# Patient Record
Sex: Male | Born: 1970 | Race: White | Hispanic: No | Smoking: Never smoker
Health system: Southern US, Community
[De-identification: ages and names within clinical notes are randomized; demographics above are authoritative.]

## PROBLEM LIST (undated history)

## (undated) DIAGNOSIS — Z9889 Other specified postprocedural states: Secondary | ICD-10-CM

## (undated) DIAGNOSIS — B029 Zoster without complications: Secondary | ICD-10-CM

## (undated) DIAGNOSIS — T63301A Toxic effect of unspecified spider venom, accidental (unintentional), initial encounter: Secondary | ICD-10-CM

## (undated) DIAGNOSIS — A4902 Methicillin resistant Staphylococcus aureus infection, unspecified site: Secondary | ICD-10-CM

## (undated) DIAGNOSIS — K219 Gastro-esophageal reflux disease without esophagitis: Secondary | ICD-10-CM

## (undated) HISTORY — DX: Toxic effect of unspecified spider venom, accidental (unintentional), initial encounter: T63.301A

## (undated) HISTORY — DX: Methicillin resistant Staphylococcus aureus infection, unspecified site: A49.02

## (undated) HISTORY — DX: Zoster without complications: B02.9

## (undated) HISTORY — DX: Gastro-esophageal reflux disease without esophagitis: K21.9

## (undated) HISTORY — PX: INGUINAL HERNIA REPAIR: SUR1180

## (undated) HISTORY — DX: Other specified postprocedural states: Z98.890

---

## 2004-11-12 DIAGNOSIS — Z9889 Other specified postprocedural states: Secondary | ICD-10-CM

## 2004-11-12 HISTORY — DX: Other specified postprocedural states: Z98.890

## 2007-02-24 DIAGNOSIS — Z9889 Other specified postprocedural states: Secondary | ICD-10-CM

## 2007-02-24 HISTORY — DX: Other specified postprocedural states: Z98.890

## 2011-05-18 ENCOUNTER — Encounter: Payer: Self-pay | Admitting: Urgent Care

## 2011-05-18 ENCOUNTER — Ambulatory Visit (INDEPENDENT_AMBULATORY_CARE_PROVIDER_SITE_OTHER): Admitting: Urgent Care

## 2011-05-18 VITALS — BP 131/85 | HR 57 | Temp 97.8°F | Ht 73.0 in | Wt 218.4 lb

## 2011-05-18 DIAGNOSIS — K625 Hemorrhage of anus and rectum: Secondary | ICD-10-CM | POA: Insufficient documentation

## 2011-05-18 LAB — CBC WITH DIFFERENTIAL/PLATELET
Basophils Absolute: 0 10*3/uL (ref 0.0–0.1)
Basophils Relative: 0 % (ref 0–1)
HCT: 41.6 % (ref 39.0–52.0)
Lymphocytes Relative: 29 % (ref 12–46)
Monocytes Absolute: 0.6 10*3/uL (ref 0.1–1.0)
Neutro Abs: 4.7 10*3/uL (ref 1.7–7.7)
Neutrophils Relative %: 61 % (ref 43–77)
RDW: 13.2 % (ref 11.5–15.5)
WBC: 7.7 10*3/uL (ref 4.0–10.5)

## 2011-05-18 MED ORDER — LIDOCAINE-HYDROCORTISONE ACE 3-1 % RE KIT: 1.0000 "application " | PACK | Freq: Two times a day (BID) | RECTAL | Status: DC

## 2011-05-18 NOTE — Progress Notes (Signed)
Primary Care Physician:  Zachery Dauer, MD, MD Primary Gastroenterologist:  Dr. Darrick Penna  Chief Complaint  Patient presents with  . Rectal Bleeding   HPI:  Ryan Christian is a 40 y.o. male here as a new patient for rectal bleeding.  Hx hemorrhoids on previous colonoscopies in 2008 &2009 done in [place].   C/o rectal bleeding 5 nights ago, was moving things out of his truck & noticed bright red bled through his pants.  +clots.  Continued bleeding x 30 mins,  [facility] ER & left after a few hrs-no labs or xrays.  BM normal once daily, soft & brown.  Taking prilosec 20mg  as needed a couple times per wk w/ certain foods.  Occ [employer].  Denies fever, chills, nausea or vomiting.  C/o fatigue.  Denies abd pain or proctalgia.   Heartburn or indigestion 3 times per week.  Hx EGD w/ Dr Allena Katz 6 yrs ago-hiatal hernia.  Denies dysphagia or odynophagia.  Appetite ok.  Wt stable.  Past Medical History  Diagnosis Date  . Hemorrhoids   . S/P colonoscopy 02/24/2007    Dr Cyndia Skeeters hemorrhoids  . S/P colonoscopy 06/09/2008    Dr Isaiah Serge Rashid-internal hemorrhoids  . S/P endoscopy 2006    Dr Patel-Hiatal hernia, otherwise normal  . GERD (gastroesophageal reflux disease)    Past Surgical History  Procedure Date  . Inguinal hernia repair 2011    [place], [place] Dr. Cameron Proud   Current Outpatient Prescriptions  Medication Sig Dispense Refill  . Aspirin-Acetaminophen (GOODYS BODY PAIN PO) Take 1 Package by mouth as needed.        Marland Kitchen omeprazole (PRILOSEC) 20 MG capsule Take 20 mg by mouth daily.        Marland Kitchen lidocaine-hydrocortisone (ANAMANTLE HC FORTE) 3-1 % KIT Place 1 application rectally 2 (two) times daily.  20 each  0  . naproxen sodium (ANAPROX) 220 MG tablet Take 220 mg by mouth 2 (two) times daily with a meal.         Allergies as of 05/18/2011  . (No Known Allergies)   Family History:  There is no known family history of colorectal carcinoma , liver disease, or inflammatory bowel disease.  History    Social History  . Marital Status: Unknown    Spouse Name: N/A    Number of Children: 2  . Years of Education: N/A   Occupational History  . Building maintenance   . [employer]    Social History Main Topics  . Smoking status: Never Smoker   . Smokeless tobacco: Not on file  . Alcohol Use: No  . Drug Use: No  . Sexually Active: Not on file   Other Topics Concern  . Not on file   Social History Narrative   married   Review of Systems: Gen: Denies any fever, chills, sweats, anorexia, fatigue, weakness, malaise, weight loss, and sleep disorder CV: Denies chest pain, angina, palpitations, syncope, orthopnea, PND, peripheral edema, and claudication. Resp: Denies dyspnea at rest, dyspnea with exercise, cough, sputum, wheezing, coughing up blood, and pleurisy. GI: Denies vomiting blood, jaundice, and fecal incontinence.    GU : Denies urinary burning, blood in urine, urinary frequency, urinary hesitancy, nocturnal urination, and urinary incontinence. MS: Denies joint pain, limitation of movement, and swelling, stiffness, low back pain, extremity pain. Denies muscle weakness, cramps, atrophy.  Derm: Denies rash, itching, dry skin, hives, moles, warts, or unhealing ulcers.  Psych: Denies depression, anxiety, memory loss, suicidal ideation, hallucinations, paranoia, and confusion. Heme: Denies bruising  and enlarged lymph nodes.  Physical Exam: BP 131/85  Pulse 57  Temp(Src) 97.8 F (36.6 C) (Temporal)  Ht 6\' 1"  (1.854 m)  Wt 218 lb 6.4 oz (99.066 kg)  BMI 28.81 kg/m2 General:   Alert,  Well-developed, well-nourished, pleasant and cooperative in NAD Head:  Normocephalic and atraumatic. Eyes:  Sclera clear, no icterus.   Conjunctiva pink. Ears:  Normal auditory acuity. Nose:  No deformity, discharge,  or lesions. Mouth:  No deformity or lesions, dentition normal. Neck:  Supple; no masses or thyromegaly. Lungs:  Clear throughout to auscultation.   No wheezes, crackles, or  rhonchi. No acute distress. Heart:  Regular rate and rhythm; no murmurs, clicks, rubs,  or gallops. Abdomen:  Soft, nontender and nondistended. No masses, hepatosplenomegaly or hernias noted. Normal bowel sounds, without guarding, and without rebound.   Rectal:  Large protruding external hemorrhoid-non-bleeding-non-tender.  No internal masses. Msk:  Symmetrical without gross deformities. Normal posture. Pulses:  Normal pulses noted. Extremities:  Without clubbing or edema. Neurologic:  Alert and  oriented x4;  grossly normal neurologically. Skin:  Intact without significant lesions or rashes. Cervical Nodes:  No significant cervical adenopathy. Psych:  Alert and cooperative. Normal mood and affect.

## 2011-05-18 NOTE — Patient Instructions (Signed)
If severe bleeding to ER If no better w/ treatment, you will need to see a surgeon NO heavy lifting OR STRAINING!   Hemorrhoids Hemorrhoids are dilated (enlarged) veins around the rectum. Sometimes clots will form in the veins. This makes them swollen and painful. These are called thrombosed hemorrhoids. Causes of hemorrhoids include:  Pregnancy: this increases the pressure in the hemorrhoidal veins.   Constipation.   Straining to have a bowel movement.  HOME CARE INSTRUCTIONS  Eat a well balanced diet and drink 6 to 8 glasses of water every day to avoid constipation. You may also use a bulk laxative.   Avoid straining to have bowel movements.   Keep anal area dry and clean.   Only take over-the-counter or prescription medicines for pain, discomfort, or fever as directed by your caregiver.  If thrombosed:  Take hot sitz baths for 20 to 30 minutes, 3 to 4 times per day.   If the hemorrhoids are very tender and swollen, place ice packs on area as tolerated. Using ice packs between sitz baths may be helpful. Fill a plastic bag with ice and use a towel between the bag of ice and your skin.   Special creams and suppositories (Anusol, Nupercainal, Wyanoids) may be used or applied as directed.   Do not use a donut shaped pillow or sit on the toilet for long periods. This increases blood pooling and pain.   Move your bowels when your body has the urge; this will require less straining and will decrease pain and pressure.   Only take over-the-counter or prescription medicines for pain, discomfort, or fever as directed by your caregiver.  SEEK MEDICAL CARE IF:  You have increasing pain and swelling that is not controlled with your prescription.   You have uncontrolled bleeding.   You have an inability or difficulty having a bowel movement.   You have pain or inflammation outside the area of the hemorrhoids.   You have chills and/or an oral temperature above 101 that lasts for 2  days or longer, or as your caregiver suggests.  MAKE SURE YOU:   Understand these instructions.   Will watch your condition.   Will get help right away if you are not doing well or get worse.  Document Released: 10/26/2000 Document Re-Released: 10/11/2008 [facility] Patient Information 2011 [place], [place].

## 2011-05-18 NOTE — Progress Notes (Signed)
Cc to PCP 

## 2011-05-18 NOTE — Assessment & Plan Note (Addendum)
Ryan Christian is a 40 y.o. Caucasian male w/rectal bleeding & external hemorrhoid.  Last colonoscopy 3 yrs ago.  Since he had significant bleeding, will check h/h, but I suspect hemorrhoid to be the culprit.  If no improvement, would consider colonoscopy or flex sig prior to referral for hemorrhoidectomy.  CBC today If severe bleeding, to ER Anamantle HC forte BID x 10 days Hemorrhoid lit Sitz bath Avoid NSAIDS!

## 2011-05-28 NOTE — Progress Notes (Signed)
Cc to Dr. Dorna Leitz

## 2011-06-07 NOTE — Progress Notes (Signed)
TCS for rectal bleeding.

## 2011-06-11 ENCOUNTER — Encounter: Payer: Self-pay | Admitting: General Practice

## 2011-06-11 ENCOUNTER — Telehealth: Payer: Self-pay | Admitting: Urgent Care

## 2011-06-11 NOTE — Progress Notes (Signed)
TCS to be arranged.  Discussed w/ pt .  Agrees w/ plan.

## 2011-06-11 NOTE — Telephone Encounter (Signed)
To Durward Mallard to schedule colonoscopy.

## 2011-06-11 NOTE — Telephone Encounter (Signed)
Had another episode of rectal bleeding. Needs colonoscopy w/ [facility]. Discussed w/ pt. Agrees w/ plan. Please call to arrange. Thanks

## 2011-06-11 NOTE — Telephone Encounter (Signed)
Pt scheduled for tcs 06/18/2011@8 :30am.  Pt aware of appt and instructions placed in the mail.

## 2011-06-12 ENCOUNTER — Telehealth: Payer: Self-pay
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
## 2011-06-13 NOTE — Telephone Encounter (Signed)
Please call pt. He may call us back to [facility].

## 2011-06-13 NOTE — Telephone Encounter (Signed)
Pt informed OK to call back and reschedule after his leg improves.

## 2011-06-18 ENCOUNTER — Encounter: Admitting: Gastroenterology

## 2011-06-18 ENCOUNTER — Encounter (HOSPITAL_COMMUNITY): Admission: RE | Payer: Self-pay | Source: Ambulatory Visit

## 2011-06-18 ENCOUNTER — Ambulatory Visit (HOSPITAL_COMMUNITY): Admission: RE | Admit: 2011-06-18 | Source: Ambulatory Visit | Admitting: Gastroenterology

## 2011-06-18 SURGERY — COLONOSCOPY
Anesthesia: Moderate Sedation

## 2011-07-04 ENCOUNTER — Ambulatory Visit (INDEPENDENT_AMBULATORY_CARE_PROVIDER_SITE_OTHER): Admitting: Urgent Care

## 2011-07-04 ENCOUNTER — Encounter: Payer: Self-pay | Admitting: Urgent Care

## 2011-07-04 DIAGNOSIS — K625 Hemorrhage of anus and rectum: Secondary | ICD-10-CM

## 2011-07-04 DIAGNOSIS — K219 Gastro-esophageal reflux disease without esophagitis: Secondary | ICD-10-CM

## 2011-07-04 MED ORDER — OMEPRAZOLE 20 MG PO CPDR: 20.0000 mg | DELAYED_RELEASE_CAPSULE | Freq: Every day | ORAL | Status: DC
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
## 2011-07-04 NOTE — Assessment & Plan Note (Signed)
Well-controlled on Prilosec 20 mg daily. Refill for 90 days with 3 refills

## 2011-07-04 NOTE — Progress Notes (Signed)
Cc to PCP 

## 2011-07-04 NOTE — Assessment & Plan Note (Signed)
Ryan Christian is a 40 y.o. caucasian male with history of rectal bleeding. He does have known hemorrhoids as well. He is here to reschedule colonoscopy that was planned last month and had to be postponed secondary to a spider bite/MRSA. Colonoscopy with Dr. Darrick Penna in the near future to determine source of his rectal bleeding which could include benign anorectal source, diverticular bleeding, polyp or carcinoma. I have discussed risks & benefits which include, but are not limited to, bleeding, infection, perforation & drug reaction.  The patient agrees with this plan & written consent will be obtained.

## 2011-08-02 MED ORDER — SODIUM CHLORIDE 0.45 % IV SOLN
Freq: Once | INTRAVENOUS | Status: AC
  Administered 2011-08-03: 08:00:00 via INTRAVENOUS

## 2011-08-03 ENCOUNTER — Encounter (HOSPITAL_COMMUNITY): Payer: Self-pay | Admitting: *Deleted

## 2011-08-03 ENCOUNTER — Other Ambulatory Visit: Payer: Self-pay | Admitting: Gastroenterology

## 2011-08-03 ENCOUNTER — Ambulatory Visit (HOSPITAL_COMMUNITY)
Admission: RE | Admit: 2011-08-03 | Discharge: 2011-08-03 | Disposition: A | Discharge status: Home / Self Care | Source: Ambulatory Visit | Attending: Gastroenterology | Admitting: Gastroenterology

## 2011-08-03 ENCOUNTER — Encounter (HOSPITAL_COMMUNITY)
Admission: RE | Disposition: A | Discharge status: Home / Self Care | Payer: Self-pay | Source: Ambulatory Visit | Attending: Gastroenterology

## 2011-08-03 DIAGNOSIS — K625 Hemorrhage of anus and rectum: Secondary | ICD-10-CM | POA: Insufficient documentation

## 2011-08-03 DIAGNOSIS — D126 Benign neoplasm of colon, unspecified: Secondary | ICD-10-CM | POA: Insufficient documentation

## 2011-08-03 DIAGNOSIS — K648 Other hemorrhoids: Secondary | ICD-10-CM | POA: Insufficient documentation

## 2011-08-03 DIAGNOSIS — Z7982 Long term (current) use of aspirin: Secondary | ICD-10-CM | POA: Insufficient documentation

## 2011-08-03 DIAGNOSIS — K5730 Diverticulosis of large intestine without perforation or abscess without bleeding: Secondary | ICD-10-CM | POA: Insufficient documentation

## 2011-08-03 DIAGNOSIS — K921 Melena: Secondary | ICD-10-CM

## 2011-08-03 DIAGNOSIS — K219 Gastro-esophageal reflux disease without esophagitis: Secondary | ICD-10-CM | POA: Insufficient documentation

## 2011-08-03 DIAGNOSIS — Z8614 Personal history of Methicillin resistant Staphylococcus aureus infection: Secondary | ICD-10-CM | POA: Insufficient documentation

## 2011-08-03 HISTORY — PX: COLONOSCOPY: SHX5424

## 2011-08-03 SURGERY — COLONOSCOPY
Anesthesia: Moderate Sedation

## 2011-08-03 MED ORDER — MEPERIDINE HCL 100 MG/ML IJ SOLN
INTRAMUSCULAR | Status: AC
  Filled 2011-08-03: qty 2

## 2011-08-03 MED ORDER — HYDROCORTISONE ACE-PRAMOXINE 1-1 % RE FOAM: 1.0000 | Freq: Two times a day (BID) | RECTAL | Status: AC

## 2011-08-03 MED ORDER — MEPERIDINE HCL 100 MG/ML IJ SOLN
INTRAMUSCULAR | Status: DC | PRN
  Administered 2011-08-03: 25 mg via INTRAVENOUS
  Administered 2011-08-03: 50 mg via INTRAVENOUS

## 2011-08-03 MED ORDER — STERILE WATER FOR IRRIGATION IR SOLN
Status: DC | PRN
  Administered 2011-08-03: 09:00:00

## 2011-08-03 MED ORDER — MIDAZOLAM HCL 5 MG/5ML IJ SOLN
INTRAMUSCULAR | Status: DC | PRN
Start: 2011-08-03 — End: 2011-08-03
  Administered 2011-08-03 (×2): 2 mg via INTRAVENOUS

## 2011-08-03 MED ORDER — MIDAZOLAM HCL 5 MG/5ML IJ SOLN
INTRAMUSCULAR | Status: AC
  Filled 2011-08-03: qty 10
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
## 2011-08-03 NOTE — Interval H&P Note (Signed)
History and Physical Interval Note:   08/03/2011   8:41 AM   Glendale Chard  has presented today for surgery, with the diagnosis of rectal bleeding  The various methods of treatment have been discussed with the patient and family. After consideration of risks, benefits and other options for treatment, the patient has consented to  Procedure(s): COLONOSCOPY as a surgical intervention .  I have reviewed the patients' chart and labs.  Questions were answered to the patient's satisfaction.     Jonette Eva  MD

## 2011-08-07 ENCOUNTER — Telehealth: Payer: Self-pay | Admitting: Gastroenterology

## 2011-08-07 NOTE — Telephone Encounter (Signed)
LMOM to call.

## 2011-08-07 NOTE — Telephone Encounter (Signed)
Please call pt. 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 TCS in 5 years. High fiber diet. All first degree relatives need TCS at age 42.

## 2011-08-07 NOTE — Telephone Encounter (Signed)
Pt called and was informed.  

## 2011-08-07 NOTE — Telephone Encounter (Signed)
Returned pt's phone call. LMOM for a return call.

## 2011-08-08 NOTE — Telephone Encounter (Signed)
Reminder in epic to have tcs in 5 yrs °

## 2011-08-10 ENCOUNTER — Encounter (HOSPITAL_COMMUNITY): Payer: Self-pay | Admitting: Gastroenterology

## 2011-09-19 ENCOUNTER — Ambulatory Visit (INDEPENDENT_AMBULATORY_CARE_PROVIDER_SITE_OTHER): Admitting: Gastroenterology

## 2011-09-19 ENCOUNTER — Encounter: Payer: Self-pay | Admitting: Gastroenterology

## 2011-09-19 DIAGNOSIS — D126 Benign neoplasm of colon, unspecified: Secondary | ICD-10-CM

## 2011-09-19 DIAGNOSIS — K625 Hemorrhage of anus and rectum: Secondary | ICD-10-CM

## 2011-09-19 NOTE — Assessment & Plan Note (Signed)
TCS IN 5 YEARS. HIGH FIBER DIET. ALL FIRST DEGREE RELATIVES NEED TCS AT AGE 42. OPV IN 6 MOS.

## 2011-09-19 NOTE — Progress Notes (Signed)
Cc to PCP 

## 2011-09-19 NOTE — Progress Notes (Signed)
  Subjective:    Patient ID: Ryan Christian, male    DOB: May 10, 1971, 40 y.o.   MRN: [id number]  HPI Last time saw rectal bleeding: sml on toilet paper yesterday. Taking fiber and it helps. No rectal pain or pressure. Heartburn controlled as long as takes and follows his diet. Colace: at least one a day. No Goody's since SEP. Doesn't take ASA either.  Past Medical History  Diagnosis Date  . Hemorrhoids   . S/P colonoscopy 02/24/2007    Dr Cyndia Skeeters hemorrhoids  . S/P colonoscopy 06/09/2008    Dr Isaiah Serge Rashid-internal hemorrhoids  . S/P endoscopy 2006    Dr Patel-Hiatal hernia, otherwise normal  . GERD (gastroesophageal reflux disease)   . MRSA infection     right leg  . Spider bite wound     right lower leg, being treated @ wound care clinic in [place]   Past Surgical History  Procedure Date  . Inguinal hernia repair 2011    [place], [place] Dr. Cameron Proud  . Colonoscopy 08/03/2011    Procedure: COLONOSCOPY;  Surgeon: Arlyce Harman, MD;  Location: [facility];  Service: Endoscopy;  Laterality: N/A;  8:30    No Known Allergies  Current Outpatient Prescriptions  Medication Sig Dispense Refill  . docusate sodium (COLACE) 100 MG capsule Take 100 mg by mouth 2 (two) times daily.      Marland Kitchen omeprazole (PRILOSEC) 20 MG capsule Take 1 capsule (20 mg total) by mouth daily.    . polycarbophil (FIBERCON) 625 MG tablet Take 625 mg by mouth daily.      .      .      .           Review of Systems     Objective:   Physical Exam  Vitals reviewed. Constitutional: He is oriented to person, place, and time. He appears well-developed and well-nourished.  HENT:  Head: Normocephalic and atraumatic.  Cardiovascular: Normal rate and normal heart sounds.   Pulmonary/Chest: Effort normal and breath sounds normal.  Abdominal: Soft. Bowel sounds are normal. He exhibits no distension. There is no tenderness.  Neurological: He is alert and oriented to person, place, and time.       NO FOCAL  DEFICITS           Assessment & Plan:

## 2011-09-19 NOTE — Assessment & Plan Note (Addendum)
2O to hemorrhoids. Responding to medical management.  Continue Colace and hemorrhoid life style recommendations. Use Prep H prn. Call for Rx for more severe flares. OPV in 6 mos. IF DESIRES SURGERY, DR. MARK BIRD MEBANE, [place] HAS BEEN REQUESTED.  Asked to [facility] PH#: [phone](wk)-to schedule appts.

## 2011-09-20 NOTE — Progress Notes (Signed)
Reminder in epic to follow up in 6 months °

## 2013-06-02 ENCOUNTER — Encounter: Payer: Self-pay | Admitting: Gastroenterology

## 2013-06-04 ENCOUNTER — Encounter: Payer: Self-pay | Admitting: Gastroenterology

## 2013-06-04 ENCOUNTER — Ambulatory Visit (INDEPENDENT_AMBULATORY_CARE_PROVIDER_SITE_OTHER): Admitting: Gastroenterology

## 2013-06-04 VITALS — BP 143/91 | HR 67 | Temp 98.8°F | Ht 73.0 in | Wt 226.0 lb

## 2013-06-04 DIAGNOSIS — D126 Benign neoplasm of colon, unspecified: Secondary | ICD-10-CM

## 2013-06-04 DIAGNOSIS — K219 Gastro-esophageal reflux disease without esophagitis: Secondary | ICD-10-CM

## 2013-06-04 DIAGNOSIS — R1032 Left lower quadrant pain: Secondary | ICD-10-CM | POA: Insufficient documentation

## 2013-06-04 MED ORDER — PANTOPRAZOLE SODIUM 40 MG PO TBEC: 40.0000 mg | DELAYED_RELEASE_TABLET | Freq: Every day | ORAL | Status: DC

## 2013-06-04 NOTE — Assessment & Plan Note (Signed)
Surveillance Sept 2017. No lower GI symptoms.

## 2013-06-04 NOTE — Patient Instructions (Addendum)
We have scheduled you for a CT scan of your belly.  I sent in Protonix for reflux to your pharmacy. Take this once each morning on an empty stomach, 30 minutes before breakfast.   We will see you back in 3 months. Please call if the discomfort returns or you have any other issues.

## 2013-06-04 NOTE — Progress Notes (Signed)
Referring Provider: Zachery Dauer, MD Primary Care Physician:  Zachery Dauer, MD PRIMARY GI: Dr. Darrick Penna  Chief Complaint  Patient presents with  . Abdominal Pain    HPI:   42 year old male presents today with complaints of abdominal pain. Last seen Nov 2012. Due for surveillance colonoscopy Sept 2017 due to multiple adenomas.  Notes a month ago had sharp epigastric discomfort while eating, although none in 1 week. Was intermittent in nature. Thought maybe associated with food choices at first. Wonders if he was swallowing too much, eating too fast. In the past week, eating smaller portions. Occasional reflux. Doesn't take Prilosec daily. No aspirin powders, no NSAIDs. No melena. Has taken Nexium in the past as well.   Feels like left lower abdomen is slightly more swollen than right. Noticed while in the shower. Feels more tired than normal. Occasional tiny, sharp discomfort in LLQ, unrelated to anything.   No rectal bleeding. No rectal pain.   Past Medical History  Diagnosis Date  . Hemorrhoids   . S/P colonoscopy 02/24/2007    Dr Cyndia Skeeters hemorrhoids  . S/P colonoscopy 06/09/2008    Dr Isaiah Serge Rashid-internal hemorrhoids  . S/P endoscopy 2006    Dr Patel-Hiatal hernia, otherwise normal  . GERD (gastroesophageal reflux disease)   . MRSA infection     right leg  . Spider bite wound     right lower leg, being treated @ wound care clinic in [place]    Past Surgical History  Procedure Laterality Date  . Inguinal hernia repair      [place], [place] Dr. Cameron Proud  . Colonoscopy  08/03/2011    ZOX:WRUEAVW polyp,multiple in the ascending colon/mild diverticulosis in the descending colon/internal hemorrhoids,large/redundant sigmoid colon. TUBULAR ADENOMAS. Due for surveillance Sept 2017    Current Outpatient Prescriptions  Medication Sig Dispense Refill  . pantoprazole (PROTONIX) 40 MG tablet Take 1 tablet (40 mg total) by mouth daily.  30 tablet  3   No current  facility-administered medications for this visit.    Allergies as of 06/04/2013  . (No Known Allergies)    Family History  Problem Relation Age of Onset  . Colon cancer Neg Hx     History   Social History  . Marital Status: Unknown    Spouse Name: N/A    Number of Children: 2  . Years of Education: N/A   Occupational History  . Building maintenance   . [employer]    Social History Main Topics  . Smoking status: Never Smoker   . Smokeless tobacco: None  . Alcohol Use: No  . Drug Use: No  . Sexually Active: None   Other Topics Concern  . None   Social History Narrative   married    Review of Systems: Negative unless mentioned in HPI.  Physical Exam: BP 143/91  Pulse 67  Temp(Src) 98.8 F (37.1 C) (Oral)  Ht 6\' 1"  (1.854 m)  Wt 226 lb (102.513 kg)  BMI 29.82 kg/m2 General:   Alert and oriented. No distress noted. Pleasant and cooperative.  Head:  Normocephalic and atraumatic. Eyes:  Conjuctiva clear without scleral icterus. Mouth:  Oral mucosa pink and moist. Good dentition. No lesions. Neck:  Supple, without mass or thyromegaly. Heart:  S1, S2 present without murmurs, rubs, or gallops. Regular rate and rhythm. Abdomen:  +BS, soft, non-tender and non-distended. No rebound or guarding. No HSM or masses noted. Unable to appreciate significant difference in LLQ vs RLQ (patient feels it is swollen). Msk:  Symmetrical  without gross deformities. Normal posture. Extremities:  Without edema. Neurologic:  Alert and  oriented x4;  grossly normal neurologically. Skin:  Intact without significant lesions or rashes. Cervical Nodes:  No significant cervical adenopathy. Psych:  Alert and cooperative. Normal mood and affect.

## 2013-06-04 NOTE — Assessment & Plan Note (Signed)
Vague reports of LLQ discomfort intermittently but with no associated symptoms such as change in bowel habits, rectal bleeding, fever, chills. Prior hx of inguinal hernia repair. He feels his LLQ is more prominent than RLQ. I was unable to appreciate this on exam. He desires to proceed with abdominal imaging. Likely benign, but we will pursue CT scan abd/pelvis. Return in 3 months.

## 2013-06-04 NOTE — Assessment & Plan Note (Signed)
42 year old male with history of chronic GERD, now with a month-long history of intermittent epigastric discomfort with certain food choices and "eating too fast". Symptoms have resolved at time of appointment; he has no associated melena, weight loss, nausea, vomiting, or dysphagia. He is NOT taking a PPI daily, which is prescribed. Likely gastritis, possible behavior/dietary related. I have sent Protonix to his pharmacy and asked him to contact us if discomfort returns. Otherwise, we will see him back in 3 months.

## 2013-06-04 NOTE — Progress Notes (Signed)
Cc PCP 

## 2013-06-08 ENCOUNTER — Ambulatory Visit (HOSPITAL_COMMUNITY)
Admission: RE | Admit: 2013-06-08 | Discharge: 2013-06-08 | Disposition: A | Discharge status: Home / Self Care | Source: Ambulatory Visit | Attending: Gastroenterology | Admitting: Gastroenterology

## 2013-06-08 DIAGNOSIS — K219 Gastro-esophageal reflux disease without esophagitis: Secondary | ICD-10-CM

## 2013-06-08 DIAGNOSIS — K4091 Unilateral inguinal hernia, without obstruction or gangrene, recurrent: Secondary | ICD-10-CM | POA: Insufficient documentation

## 2013-06-08 DIAGNOSIS — R1032 Left lower quadrant pain: Secondary | ICD-10-CM | POA: Insufficient documentation

## 2013-06-08 MED ORDER — IOHEXOL 300 MG/ML  SOLN
100.0000 mL | Freq: Once | INTRAMUSCULAR | Status: AC | PRN
  Administered 2013-06-08: 100 mL via INTRAVENOUS

## 2013-06-15 ENCOUNTER — Telehealth: Payer: Self-pay | Admitting: *Deleted

## 2013-06-15 NOTE — Telephone Encounter (Signed)
Routing to AS for results.  

## 2013-06-15 NOTE — Telephone Encounter (Signed)
Pt called to get results from CT, please advise [phone]

## 2013-06-16 NOTE — Progress Notes (Signed)
Quick Note:  Recurrent fat-containing inguinal hernia.  Patient notified and will call us with where to fax results. ______

## 2013-06-16 NOTE — Telephone Encounter (Signed)
Spoke with patient. Recurrent left inguinal hernia only containing fat. Patient will call [place] with which surgeon he will see (veteran).

## 2013-06-17 NOTE — Telephone Encounter (Signed)
Patient called back and said you can fax records to [phone] Ryan Christian

## 2013-06-22 NOTE — Telephone Encounter (Signed)
Faxed to [facility]

## 2013-07-13 HISTORY — PX: INGUINAL HERNIA REPAIR: SUR1180

## 2013-08-03 ENCOUNTER — Ambulatory Visit: Payer: Self-pay | Admitting: Surgery

## 2013-08-11 ENCOUNTER — Telehealth: Payer: Self-pay

## 2013-08-11 ENCOUNTER — Ambulatory Visit (INDEPENDENT_AMBULATORY_CARE_PROVIDER_SITE_OTHER): Admitting: Gastroenterology

## 2013-08-11 ENCOUNTER — Encounter: Payer: Self-pay | Admitting: Gastroenterology

## 2013-08-11 VITALS — BP 129/83 | HR 83 | Temp 98.1°F | Ht 75.0 in | Wt 213.0 lb

## 2013-08-11 DIAGNOSIS — K645 Perianal venous thrombosis: Secondary | ICD-10-CM

## 2013-08-11 LAB — COMPREHENSIVE METABOLIC PANEL
Albumin: 4 g/dL (ref 3.4–5.0)
Anion Gap: 5 — ABNORMAL LOW (ref 7–16)
BUN: 9 mg/dL (ref 7–18)
Bilirubin,Total: 1.1 mg/dL — ABNORMAL HIGH (ref 0.2–1.0)
Chloride: 101 mmol/L (ref 98–107)
Co2: 29 mmol/L (ref 21–32)
EGFR (African American): >60
EGFR (Non-African Amer.): >60
SGOT(AST): 23 U/L (ref 15–37)
SGPT (ALT): 23 U/L (ref 12–78)
Sodium: 135 mmol/L — ABNORMAL LOW (ref 136–145)
Total Protein: 8.2 g/dL (ref 6.4–8.2)

## 2013-08-11 LAB — CBC
Platelet: 220 10*3/uL (ref 150–440)
RDW: 13.8 % (ref 11.5–14.5)

## 2013-08-11 NOTE — Progress Notes (Signed)
Referring Provider: Zachery Dauer, MD Primary Care Physician:  Zachery Dauer, MD Primary GI: Dr. Darrick Penna   Chief Complaint  Patient presents with  . Rectal Pain    HPI:   Ryan Christian is a pleasant 42 year old male presenting today as an urgent work-in due to severe rectal pain and concern for prolapsed hemorrhoids. Last colonoscopy in Sept 2012 by Dr. Darrick Penna with multiple tubular adenomas; due for surveillance Sept 2017. He has a known history of large internal hemorrhoids. Recently had inguinal hernia repair on 9/22. Will be seeing surgeon for follow-up tomorrow.   Rectal pain started Sunday. Constipation after surgery 9/22. Laxatives finally kicked in on Sunday. Felt like he had to go to bathroom but couldn't. Mild straining yesterday. Able to manually reduce on own. Preparation H suppositories early this morning. Severe pain, wants something done. Wore diapers due to rectal bleeding. Manual reduction makes rectal pain worse.   Past Medical History  Diagnosis Date  . Hemorrhoids   . S/P colonoscopy 02/24/2007    Dr Cyndia Skeeters hemorrhoids  . S/P colonoscopy 06/09/2008    Dr Isaiah Serge Rashid-internal hemorrhoids  . S/P endoscopy 2006    Dr Patel-Hiatal hernia, otherwise normal  . GERD (gastroesophageal reflux disease)   . MRSA infection     right leg  . Spider bite wound     right lower leg, being treated @ wound care clinic in [place]    Past Surgical History  Procedure Laterality Date  . Inguinal hernia repair      [place], [place] Dr. Cameron Proud  . Colonoscopy  08/03/2011    ZOX:WRUEAVW polyp,multiple in the ascending colon/mild diverticulosis in the descending colon/internal hemorrhoids,large/redundant sigmoid colon. TUBULAR ADENOMAS. Due for surveillance Sept 2017  . Inguinal hernia repair  sept 2014    Current Outpatient Prescriptions  Medication Sig Dispense Refill  . oxyCODONE-acetaminophen (PERCOCET) 10-325 MG per tablet Take 1 tablet by mouth every 4 (four) hours as  needed for pain.      . pantoprazole (PROTONIX) 40 MG tablet Take 1 tablet (40 mg total) by mouth daily.  30 tablet  3   No current facility-administered medications for this visit.    Allergies as of 08/11/2013  . (No Known Allergies)    Family History  Problem Relation Age of Onset  . Colon cancer Neg Hx     History   Social History  . Marital Status: Unknown    Spouse Name: N/A    Number of Children: 2  . Years of Education: N/A   Occupational History  . Building maintenance   . [employer]    Social History Main Topics  . Smoking status: Never Smoker   . Smokeless tobacco: None  . Alcohol Use: No  . Drug Use: No  . Sexual Activity: None   Other Topics Concern  . None   Social History Narrative   married    Review of Systems: As mentioned in HPI  Physical Exam: BP 129/83  Pulse 83  Temp(Src) 98.1 F (36.7 C) (Oral)  Ht 6\' 3"  (1.905 m)  Wt 213 lb (96.616 kg)  BMI 26.62 kg/m2 General:   Alert and oriented. No distress noted. Pleasant and cooperative.  Abdomen:  +BS, soft, non-tender and non-distended. No rebound or guarding. Lap sites intact with surgical dressing.  Rectal: With patient laying in left lateral recumbent position, thrombosed hemorrhoid seen externally noted on right side, approximately size of small walnut. On left lateral entrance of rectum, question necrotic/thrombosed  appearing internal  hemorrhoid, friable. Not easily reducible. Significantly tender to touch.  Extremities:  Without edema. Neurologic:  Alert and  oriented x4;  grossly normal neurologically. Psych:  Alert and cooperative. Normal mood and affect.   Marland Kitchen

## 2013-08-11 NOTE — Telephone Encounter (Signed)
Pt's wife left VM and I returned her call. She said pt is having excruciating hemorrhoid pain. He had hernia surgery on 08/03/2013 and got constipated from the pain meds. He is in so much pain she had to go to [facility] at 12:30 Am. To get him some prep H. Gave him OV with AS in urgent 11:30 slot today.

## 2013-08-11 NOTE — Patient Instructions (Addendum)
You have an appointment to see Dr. Lovell Sheehan today at 1:30 for assessment.  We will have you come back in 4 weeks.

## 2013-08-11 NOTE — Progress Notes (Signed)
CC'd to PCP 

## 2013-08-11 NOTE — Assessment & Plan Note (Addendum)
42 year old male with known large internal hemorrhoids and colonoscopy up-to-date as of Sept 2012, presenting with interesting presentation of what appears to be almost necrotic-appearing large mass-like thrombosed internal hemorrhoid, with a separate thrombosed hemorrhoid noted externally. Significant pain and tenderness to palpation, and patient is in obvious discomfort. At this point, supportive measures have failed and recommend surgical evaluation for assessment and likely excision. Outpatient CRH banding not an option with his current presentation; however, this could be considered in the future depending on the outcome of surgical assessment today. Dr. Lovell Sheehan' office notified, and they have graciously agreed to see patient today at 1:30 pm. Return in 4 weeks to see Dr. Darrick Penna.

## 2013-08-14 ENCOUNTER — Inpatient Hospital Stay: Payer: Self-pay | Admitting: Surgery

## 2013-08-14 LAB — PATHOLOGY REPORT

## 2013-08-31 ENCOUNTER — Ambulatory Visit: Admitting: Gastroenterology

## 2013-09-14 ENCOUNTER — Ambulatory Visit: Admitting: Gastroenterology

## 2013-09-16 ENCOUNTER — Ambulatory Visit (INDEPENDENT_AMBULATORY_CARE_PROVIDER_SITE_OTHER): Admitting: Gastroenterology

## 2013-09-16 ENCOUNTER — Encounter: Payer: Self-pay | Admitting: Gastroenterology

## 2013-09-16 VITALS — BP 128/81 | HR 86 | Temp 98.7°F | Ht 73.0 in | Wt 219.6 lb

## 2013-09-16 DIAGNOSIS — K645 Perianal venous thrombosis: Secondary | ICD-10-CM

## 2013-09-16 NOTE — Patient Instructions (Signed)
DRINK WATER TO KEEP YOUR URINE LIGHT YELLOW.  FOLLOW A HIGH FIBER DIET. SEE INFO BELOW.  TO KEEP YOUR BOWELS REGULAR, USE FIBER POWDER OR 1 PACKET ONCE DAILY FOR 3 DAYS THEN TWICE DAILY FOR 3 DAYS THEN THREE TIMES A DAY. AVOID HIGHER DOSES IF IT CAUSES BLOATING & GAS.  FOLLOW UP IN 1 YEAR. CALL WITH QUESTIONS OR CONCERNS.  High-Fiber Diet A high-fiber diet changes your normal diet to include more whole grains, legumes, fruits, and vegetables. Changes in the diet involve replacing refined carbohydrates with unrefined foods. The calorie level of the diet is essentially unchanged. The Dietary Reference Intake (recommended amount) for adult males is 38 grams per day. For adult females, it is 25 grams per day. Pregnant and lactating women should consume 28 grams of fiber per day. Fiber is the intact part of a plant that is not broken down during digestion. Functional fiber is fiber that has been isolated from the plant to provide a beneficial effect in the body. PURPOSE  Increase stool bulk.   Ease and regulate bowel movements.   Lower cholesterol.  INDICATIONS THAT YOU NEED MORE FIBER  Constipation and hemorrhoids.   Uncomplicated diverticulosis (intestine condition) and irritable bowel syndrome.   Weight management.   As a protective measure against hardening of the arteries (atherosclerosis), diabetes, and cancer.   GUIDELINES FOR INCREASING FIBER IN THE DIET  Start adding fiber to the diet slowly. A gradual increase of about 5 more grams (2 slices of whole-wheat bread, 2 servings of most fruits or vegetables, or 1 bowl of high-fiber cereal) per day is best. Too rapid an increase in fiber may result in constipation, flatulence, and bloating.   Drink enough water and fluids to keep your urine clear or pale yellow. Water, juice, or caffeine-free drinks are recommended. Not drinking enough fluid may cause constipation.   Eat a variety of high-fiber foods rather than one type of fiber.    Try to increase your intake of fiber through using high-fiber foods rather than fiber pills or supplements that contain small amounts of fiber.   The goal is to change the types of food eaten. Do not supplement your present diet with high-fiber foods, but replace foods in your present diet.  INCLUDE A VARIETY OF FIBER SOURCES  Replace refined and processed grains with whole grains, canned fruits with fresh fruits, and incorporate other fiber sources. White rice, white breads, and most bakery goods contain little or no fiber.   Brown whole-grain rice, buckwheat oats, and many fruits and vegetables are all good sources of fiber. These include: broccoli, Brussels sprouts, cabbage, cauliflower, beets, sweet potatoes, white potatoes (skin on), carrots, tomatoes, eggplant, squash, berries, fresh fruits, and dried fruits.   Cereals appear to be the richest source of fiber. Cereal fiber is found in whole grains and bran. Bran is the fiber-rich outer coat of cereal grain, which is largely removed in refining. In whole-grain cereals, the bran remains. In breakfast cereals, the largest amount of fiber is found in those with "bran" in their names. The fiber content is sometimes indicated on the label.   You may need to include additional fruits and vegetables each day.   In baking, for 1 cup white flour, you may use the following substitutions:   1 cup whole-wheat flour minus 2 tablespoons.   1/2 cup white flour plus 1/2 cup whole-wheat flour.

## 2013-09-16 NOTE — Progress Notes (Signed)
REVIEWED.  

## 2013-09-16 NOTE — Assessment & Plan Note (Signed)
SX RESOLVED AFTER SURGERY.  DRINK WATER EAT FIBER OPV IN 1 YEAR. CALL WITH QUESTIONS OR CONCERNS.

## 2013-09-16 NOTE — Progress Notes (Signed)
  Subjective:    Patient ID: Ryan Christian, male    DOB: [birth year], 42 y.o.   MRN: [id number]  Zachery Dauer, MD  HPI HERNIA SURGERY ON SEP 2014. HEMORRHOID SURGERY OCT 2014. NO BM FOR 3 DAYS. STARTED TO FEED. 1ST BM-BUTT ON FIRE. HAD 2 HEMORRHOID COLUMNS "CUT OUT?"  WAS ON A MORPHINE PUMP. GAINING WEIGHT. BMs: GOOD. PT DENIES FEVER, CHILLS, BRBPR, nausea, vomiting, melena, diarrhea, constipation, abd pain, problems swallowing, problems with sedation, OR heartburn or indigestion. PLACED ON FLOMAX AFTER SURGERY DUE TO URINARY RETENTION.  Past Medical History  Diagnosis Date  . Hemorrhoids   . S/P colonoscopy 02/24/2007    Dr Cyndia Skeeters hemorrhoids  . S/P colonoscopy 06/09/2008    Dr Isaiah Serge Rashid-internal hemorrhoids  . S/P endoscopy 2006    Dr Patel-Hiatal hernia, otherwise normal  . GERD (gastroesophageal reflux disease)   . MRSA infection     right leg  . Spider bite wound     right lower leg, being treated @ wound care clinic in [place]   Past Surgical History  Procedure Laterality Date  . Inguinal hernia repair      [place], [place] Dr. Cameron Proud  . Colonoscopy  08/03/2011    FAO:ZHYQMVH polyp,multiple in the ascending colon/mild diverticulosis in the descending colon/internal hemorrhoids,large/redundant sigmoid colon. TUBULAR ADENOMAS. Due for surveillance Sept 2017  . Inguinal hernia repair  sept 2014   No Known Allergies  Current Outpatient Prescriptions  Medication Sig Dispense Refill  . oxyCODONE-acetaminophen (PERCOCET) 10-325 MG per tablet Take 1 tablet by mouth every 4 (four) hours as needed for pain.      . pantoprazole (PROTONIX) 40 MG tablet Take 1 tablet (40 mg total) by mouth daily.      Review of Systems     Objective:   Physical Exam  Vitals reviewed. Constitutional: He is oriented to person, place, and time. He appears well-nourished. No distress.  HENT:  Head: Normocephalic and atraumatic.  Mouth/Throat: Oropharynx is clear and moist. No oropharyngeal  exudate.  Eyes: Pupils are equal, round, and reactive to light. No scleral icterus.  Neck: Normal range of motion. Neck supple.  Cardiovascular: Normal rate, regular rhythm and normal heart sounds.   Pulmonary/Chest: Effort normal and breath sounds normal. No respiratory distress.  Abdominal: Soft. Bowel sounds are normal. He exhibits no distension. There is no tenderness.  INCISIONS WELL HEALED    Musculoskeletal: Normal range of motion. He exhibits no edema.  Lymphadenopathy:    He has no cervical adenopathy.  Neurological: He is alert and oriented to person, place, and time.  NO FOCAL DEFICITS   Psychiatric: He has a normal mood and affect.          Assessment & Plan:

## 2013-09-17 ENCOUNTER — Other Ambulatory Visit: Payer: Self-pay

## 2013-09-21 NOTE — Progress Notes (Signed)
cc'd to pcp 

## 2013-11-12 HISTORY — PX: EXCISIONAL HEMORRHOIDECTOMY: SHX1541

## 2014-01-26 NOTE — Progress Notes (Signed)
REVIEWED.  

## 2014-02-03 NOTE — Progress Notes (Signed)
HERNIA SURGERY ON SEP 2014. HEMORRHOID SURGERY OCT 2014.

## 2014-07-08 ENCOUNTER — Telehealth: Payer: Self-pay | Admitting: Gastroenterology

## 2014-07-08 NOTE — Telephone Encounter (Signed)
I called pt and he said he is not in any pain. He is just having some bleeding and it stains his underwear and gets wet. Please advise!

## 2014-07-08 NOTE — Telephone Encounter (Signed)
LMOM for pt that he will need to see a surgeon and if he would like a referral to please call.

## 2014-07-08 NOTE — Telephone Encounter (Signed)
I returned his call and he is aware he needs to see surgeon. See previous note.

## 2014-07-08 NOTE — Telephone Encounter (Signed)
Patient is c/o rectal bleeding, knows he has hemorrhoids, wanting hemorroid banding no appointments w/SLF the whole month of September, please advise?

## 2014-07-08 NOTE — Telephone Encounter (Addendum)
PLEASE CALL PT. HE NEEDS TO SEE A SURGEON FOR HEMORRHOID SURGERY.

## 2014-07-08 NOTE — Telephone Encounter (Signed)
Pt was returning a call to DS. Please call him back at [phone]

## 2014-07-08 NOTE — Telephone Encounter (Signed)
Pt returned call and was informed. He does not want a referral at this time, he will use one he has previously used and if he has problems or questions he will call.

## 2014-08-24 ENCOUNTER — Encounter: Payer: Self-pay | Admitting: Gastroenterology

## 2014-11-12 DIAGNOSIS — B029 Zoster without complications: Secondary | ICD-10-CM

## 2014-11-12 HISTORY — DX: Zoster without complications: B02.9

## 2015-01-27 IMAGING — CR DG ABDOMEN 1V
1 series · 2 of 2 positions shown · non-contrast
Comparison: none

REASON FOR EXAM: unable to have BM
COMMENTS:

PROCEDURE:     DXR - DXR KIDNEY URETER BLADDER  - August 11, 2013  [DATE]
RESULT:

[Series 1: t abdomen supine · 0.14mm/px · 2 of 2 slices shown]
[im 1/2]
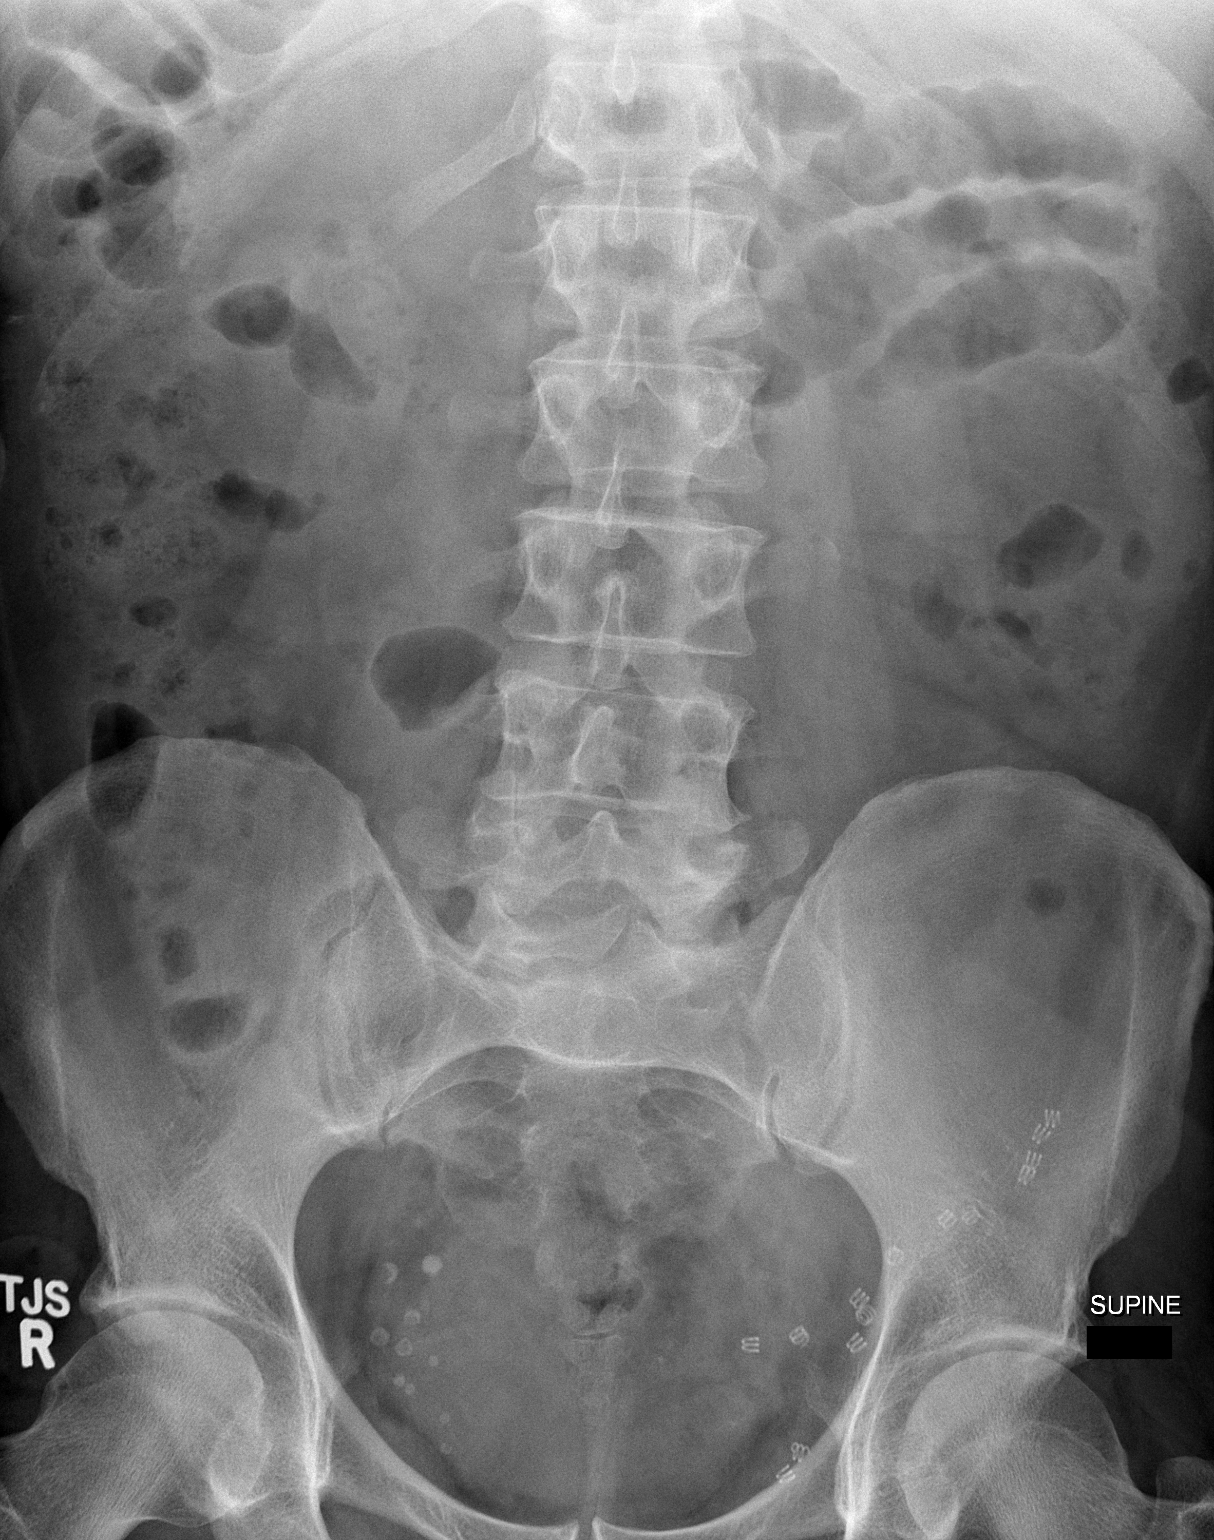
[im 2/2]
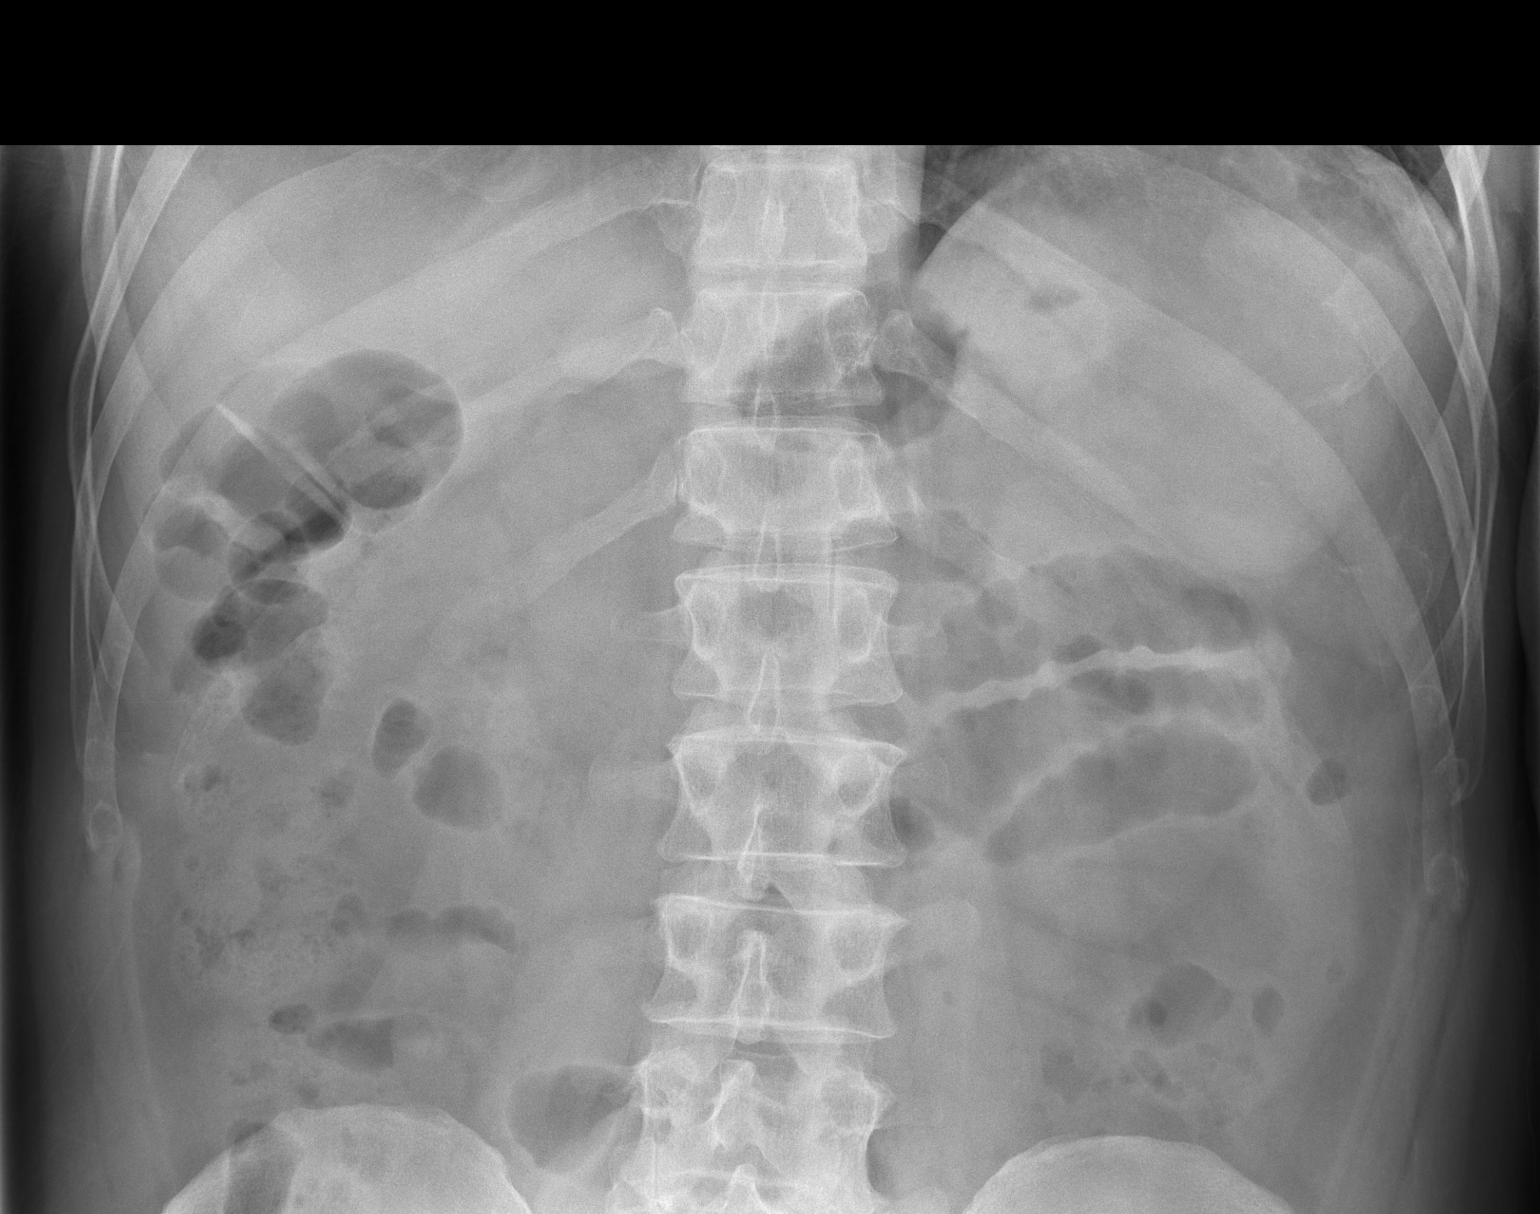

[2 of 2 positions shown; findings below may reference images not displayed]

FINDINGS: Air is seen within nondilated loops of large and small bowel. The
visualized bony skeleton is unremarkable. Phleboliths and surgical wires are
identified within the pelvis.
IMPRESSION: Nonobstructive bowel gas pattern.

## 2015-03-04 NOTE — Discharge Summary (Signed)
PATIENT NAME:  Ryan Christian, Ryan Christian MR#:  [id number] DATE OF BIRTH:  [birth year]  DATE OF ADMISSION:  08/14/2013 DATE OF DISCHARGE:  08/16/2013  DISCHARGE DIAGNOSES:  1. Incarcerated internal and thrombosed external hemorrhoids, status post 2 column excisional closed hemorrhoidectomy.  2. History of left inguinal hernia repair.  3. History of hiatal hernia.  4. History of reflux.  5. History of lipoma excision of left forearm.  6. Preoperative and postoperative urinary retention requiring Foley catheter.   DISCHARGE MEDICATIONS: As follows:  1. Percocet 1 to 2 tabs p.o. q.6 hours p.r.n. pain.  2. Protonix 40 mg p.o. daily.  3. Aleve 220 one tab p.o. q.8 hours.  4. Flomax 0.4 mg p.o. daily.  5. Vitamin B3 p.o. daily.  6. Mineral oil 30 ml p.o. daily.  7. MiraLAX 17 g p.o. daily.   INDICATION FOR ADMISSION: Mr. Tomaro is a pleasant 44 year old male who presented with incarcerated prolapsed internal hemorrhoids and thrombosed external hemorrhoids. He was admitted for pain control and possible excisional closed hemorrhoidectomy.   HOSPITAL COURSE: As follows: Mr. Seipp was admitted on September 30th. He was given IV pain meds and hydrated. He underwent 2 column excisional hemorrhoidectomy the following day. He had a Foley placed at admission, and this was later discontinued with some success on October 3rd; however, on October 4th, he had recurrent urinary retention. Therefore, a Foley was placed. He was also began on Flomax at that time. He was discharged home with good p.o. pain control and was voiding with a Foley catheter and was having bowel movements.   DISCHARGE INSTRUCTIONS: As follows: Mr. Konopka is to call or return to the ED if he has increased pain, redness, drainage from buttocks or difficulty with constipation. His wife has suggested having a urologist in [place] where he is from, and I was in agreement with this to help with Foley discontinuation. He is to follow up with me in  approximately 1 week to evaluate for pain control and if need be to assist with discontinuing Foley.   ____________________________ Glena Norfolk Krystine Pabst, MD cal:gb D: 08/24/2013 21:24:17 ET T: 08/24/2013 23:35:12 ET JOB#: [id number]  cc: Harrell Gave A. Demani Weyrauch, MD, <Dictator> Floyde Parkins MD ELECTRONICALLY SIGNED 08/25/2013 23:29

## 2015-03-04 NOTE — Op Note (Signed)
PATIENT NAME:  Ryan Christian, Ryan Christian MR#:  [id number] DATE OF BIRTH:  [birth year]  DATE OF PROCEDURE:  08/03/2013  PREOPERATIVE DIAGNOSIS: Recurrent left inguinal hernia.   POSTOPERATIVE DIAGNOSIS: Recurrent direct and indirect inguinal hernia.   PROCEDURE PERFORMED: Transabdominal repair of an inguinal hernia with C-Qur coated  mesh.   SURGEON: Wang Granada A. Marina Gravel, M.D.   ASSISTANT: Dr. Burt Knack.  TYPE OF ANESTHESIA: General endotracheal and local.   INDICATIONS: The patient is a 44 year old otherwise healthy white male who underwent a preperitoneal opening Kugel repair patch of a direct inguinal hernia in the year 2011. The patient has developed pain and recurrent swelling in his left groin. Preoperative imaging with a CT scan demonstrates a recurrent left inguinal hernia.   FINDINGS: There was both an indirect and a direct inguinal hernia.   SPECIMENS: None.   ESTIMATED BLOOD LOSS: Minimal.   DESCRIPTION OF PROCEDURE: With informed consent, supine position, general endotracheal anesthesia, Foley catheter was placed under sterile technique. The patient's abdomen was widely clipped of hair in the holding area, prepped and draped with ChloraPrep solution. Timeout was observed.   A short transverse infraumbilical skin incision was fashioned with a scalpel and carried down with blunt dissection to the fascia. Tracheal hook was used to elevate the fascia. Veress needle was placed. Drop test was ensured. A total of 2.5 liters of CO2 was instilled. Veress needle was removed. An 11 mm Versaport was then placed with ease. Camera was used to confirm intraperitoneal location. CO2 was inflated, 15 mm of pressure was ensured. The patient was then positioned in Trendelenburg position. Two 5-mm operating trocars were placed in the right lower abdomen. The sigmoid colon was taken off the area of the hernia with sharp dissection through the epiploic fat and point cautery was applied. The peritoneum overlying the left  inguinal region was then incised. It was reflected back.  There appeared to be both a direct and an indirect component of recurrent hernia. Attention was then turned to the encirclement of the spermatic vessels on the left side. This was easily accomplished. The space lateral to the mesh was then identified and muscle was identified with the nerve being identified inferiorly. The indirect inguinal hernia sac was then reduced. This was lateral to the mesh. It was reduced back into the abdomen.  A densely adherent recurrent direct inguinal hernia was found underneath the inferior portion of the mesh and was reduced. Cooper's ligament was identified and cleared. A large piece of coated C-Qur mesh measuring 8 x 10 cm was placed into the abdomen with a slit for the egress of the spermatic vessels. This was then inserted into the abdominal cavity. It should be noted that the vas was medial to the vas and vessels and was rather stuck and therefore was left underneath the mesh but the mesh encircled the spermatic cord vessels.   The coated side of the mesh was then placed towards the abdominal contents. It was secured to the Cooper's ligament with the ProTack, two tails were then crisscrossed encircling the mesh. They were attached laterally and anteriorly on the abdominal wall with several ProTack. Several more ProTacks were then placed on Cooper's ligament. Mesh to mesh tacking was then performed to re-create the internal ring. The peritoneum was then partially reapproximated over the mesh, but a full closure could not be obtained due to foreshortening of the peritoneum from the previous mesh placement.   Ports were then removed under direct visualization. The sigmoid colon was then placed over  the mesh with epiploic fat covering between the mesh and the colon. Ports were then removed under direct visualization. The infraumbilical fascial defect being reapproximated with a figure-of-eight #0 Vicryl suture in vertical  orientation. A total of 30 mL of 0.25% plain Marcaine was infiltrated along all skin and fascial incisions prior to closure.   A 4-0 Vicryl subcuticular was applied to all skin edges, followed by the application of benzoin, Steri-Strips, Telfa and Tegaderm. The patient was subsequently extubated and taken to the recovery room in stable and satisfactory condition with Foley catheter being removed.    ____________________________ Jeannette How Marina Gravel, MD mab:sg D: 08/04/2013 08:50:13 ET T: 08/04/2013 09:22:40 ET JOB#: [id number]  cc: Elta Guadeloupe A. Marina Gravel, MD, <Dictator> Hortencia Conradi MD ELECTRONICALLY SIGNED 08/05/2013 10:24

## 2015-03-04 NOTE — Op Note (Signed)
PATIENT NAME:  Ryan Christian, Ryan Christian MR#:  [id number] DATE OF BIRTH:  06/21/1971  DATE OF PROCEDURE:  08/12/2013  PREOPERATIVE DIAGNOSIS: Incarcerated and necrotic internal hemorrhoids and thrombosed external hemorrhoids.   POSTOPERATIVE DIAGNOSIS: Thrombosed external hemorrhoids, necrotic and incarcerated internal hemorrhoid and two other inflamed hemorrhoidal columns.   PROCEDURE PERFORMED: Two column excisional closed hemorrhoidectomy.   ESTIMATED BLOOD LOSS: 15 mL.   COMPLICATIONS: None.   SPECIMEN: Hemorrhoids.   ANESTHESIA: General.   INDICATION FOR SURGERY: [name] is a pleasant 44 year old male who had recently undergone an inguinal hernia repair, who presented with anal pain and thrombosed incarcerated and necrotic internal hemorrhoids. He was thus brought to the operating room suite for semiemergent hemorrhoidectomy.   DETAILS OF PROCEDURE:   Informed consent was obtained. [name] was brought to the operating room suite. He was induced. Endotracheal tube was placed, general anesthesia was administered. He was then laid prone on the operating room table. His buttocks were prepped and draped in standard surgical fashion. A timeout was then performed correctly identifying the patient name, operative site and procedure to be performed. I then used 1% lidocaine with epinephrine to infiltrate the skin and injected in between the sphincter muscles to relax his anus as well. I then grabbed the internal hemorrhoid which was prolapsed and placed a 3-0 chromic stitch at the apex of the hemorrhoid. I then excised the hemorrhoid and the thrombosed external hemorrhoid using Bovie electrocautery and then closed the mucosa and perianal dermis with the previously placed suture using a running locking stitch and taking bites of the sphincter as I did,  so this stitch was then tied and ligated. That was the right posterior column. I then  noticed a large inflamed internal hemorrhoids, as well as another  thrombosed hemorrhoid in the left lateral position. I then did the same thing where I took an anchoring at the apex of the hemorrhoid and excised the internal and external hemorrhoids, and closed the mucosa and perianal skin with a running locking 3-0 chromic stitch. The anus was then irrigated, looked for hemostasis and then placed a topical antibiotic jelly in his anus and covered the wound with sterile gauze. He was then awoken, extubated and brought to the postanesthesia care unit. There were no immediate complications. Needle, sponge, and instrument counts were correct at the end of the procedure.    ____________________________ Glena Norfolk. Lakyn Alsteen, MD cal:sg D: 08/13/2013 12:24:56 ET T: 08/13/2013 12:46:37 ET JOB#: [id number]  cc: Harrell Gave A. Khanh Cordner, MD, <Dictator> Floyde Parkins MD ELECTRONICALLY SIGNED 08/24/2013 21:10

## 2015-03-04 NOTE — H&P (Signed)
History of Present Illness 42 yowm POD 8 from transabdominal recurrent inguinal hernia repair. Was constipated from Percocet postop until taking a laxative a few days ago. After a normal BM and a few little hard stool balls he developed a proplapsed hemorrhoid and hasn't been able to have a BM since (despite fecal urgency). Last voided 8 hours ago and feels he needs to urinate too, but can't. Had an outpatient hemorrhoidal thrombectomy 20 years ago. No fever, chills. Hasn't taken analgesics for days (other than 1/2 Percocet for insomnia and mild pain qhs).   Past Med/Surgical Hx:  Hiatal Hernia:   GERD - Esophageal Reflux:   Migraines:   Left Inguinal Hernia Repair:   Left Forearm Lipoma Excision:   ALLERGIES:  No Known Allergies:   HOME MEDICATIONS: Medication Instructions Status  acetaminophen-oxyCODONE 325 mg-5 mg oral tablet 1 tab(s) orally every 6 hours, As Needed - for Pain Active  pantoprazole 40 mg oral delayed release tablet 1 tab(s) orally once a day Active  Aleve sodium 220 mg oral tablet 1 tab(s) orally every 8 hours, As Needed Active  Vitamin D3 1000 intl units oral capsule 1 cap(s) orally once a day Active   Family and Social History:  Family History Non-Contributory   Social History negative ETOH, negative Illicit drugs   Place of Living Home   Review of Systems:  Fever/Chills No   Cough No   Sputum No   Abdominal Pain No   Diarrhea No   Constipation Yes   Nausea/Vomiting No   SOB/DOE No   Chest Pain No   Dysuria No   Tolerating PT Yes   Tolerating Diet Yes   Medications/Allergies Reviewed Medications/Allergies reviewed   Physical Exam:  GEN well developed, well nourished, no acute distress   HEENT pink conjunctivae, PERRL, hearing intact to voice, moist oral mucosa, Oropharynx clear   NECK supple  No masses  thyroid not tender  trachea midline   RESP normal resp effort  clear BS  no use of accessory muscles   CARD regular rate  no  murmur  no JVD  no Rub   ABD denies tenderness   EXTR negative cyanosis/clubbing, negative edema   SKIN normal to palpation, No rashes, skin turgor good   NEURO cranial nerves intact, negative tremor, follows commands, motor/sensory function intact   PSYCH alert, A+O to time, place, person, good insight   Additional Comments Anal exam - right column only: large, permanently proplapsed hemorrhoidal column with thrombosis and necrosis.   Lab Results: Hepatic:  30-Sep-14 19:18   Bilirubin, Total  1.1  Alkaline Phosphatase 96  SGPT (ALT) 23  SGOT (AST) 23  Total Protein, Serum 8.2  Albumin, Serum 4.0  Routine Chem:  30-Sep-14 19:18   Glucose, Serum 86  BUN 9  Creatinine (comp) 0.93  Sodium, Serum  135  Potassium, Serum 3.8  Chloride, Serum 101  CO2, Serum 29  Calcium (Total), Serum 9.0  Osmolality (calc) 268  eGFR (African American) >60  eGFR (Non-African American) >60 (eGFR values <78m/min/1.73 m2 may be an indication of chronic kidney disease (CKD). Calculated eGFR is useful in patients with stable renal function. The eGFR calculation will not be reliable in acutely ill patients when serum creatinine is changing rapidly. It is not useful in  patients on dialysis. The eGFR calculation may not be applicable to patients at the low and high extremes of body sizes, pregnant women, and vegetarians.)  Anion Gap  5  Routine Hem:  30-Sep-14 19:18  WBC (CBC) 9.9  RBC (CBC) 4.40  Hemoglobin (CBC)  12.9  Hematocrit (CBC)  37.1  Platelet Count (CBC) 220 (Result(s) reported on 11 Aug 2013 at 07:32PM.)  MCV 84  MCH 29.3  MCHC 34.8  RDW 13.8    Assessment/Admission Diagnosis 1. Grade IV hemorrhoid, righ column, with thrombosis and necrosis. 2. Urinary retention on basis of # 1   Plan Admit, Foley catheter, Sit baths, Hemorrhoidectomy   Electronic Signatures: Consuela Mimes (MD)  (Signed 30-Sep-14 20:14)  Authored: CHIEF COMPLAINT and HISTORY, PAST MEDICAL/SURGIAL  HISTORY, ALLERGIES, HOME MEDICATIONS, FAMILY AND SOCIAL HISTORY, REVIEW OF SYSTEMS, PHYSICAL EXAM, LABS, ASSESSMENT AND PLAN   Last Updated: 30-Sep-14 20:14 by Consuela Mimes (MD)

## 2016-06-13 ENCOUNTER — Encounter: Payer: Self-pay | Admitting: Gastroenterology

## 2016-11-22 ENCOUNTER — Telehealth: Payer: Self-pay

## 2016-11-22 NOTE — Telephone Encounter (Signed)
[phone]  PATIENT RECEIVED LETTER TO SCHEDULE TCS

## 2016-11-22 NOTE — Telephone Encounter (Signed)
PT has OV with Walden Field, NP on 12/12/2016 at 3:30 pm for rectal bleeding and recent hematemesis.

## 2016-12-12 ENCOUNTER — Other Ambulatory Visit: Payer: Self-pay

## 2016-12-12 ENCOUNTER — Ambulatory Visit (INDEPENDENT_AMBULATORY_CARE_PROVIDER_SITE_OTHER): Admitting: Nurse Practitioner

## 2016-12-12 ENCOUNTER — Encounter: Payer: Self-pay | Admitting: Nurse Practitioner

## 2016-12-12 VITALS — BP 138/87 | HR 85 | Temp 97.9°F | Ht 72.0 in | Wt 222.0 lb

## 2016-12-12 DIAGNOSIS — K649 Unspecified hemorrhoids: Secondary | ICD-10-CM

## 2016-12-12 DIAGNOSIS — K921 Melena: Secondary | ICD-10-CM

## 2016-12-12 DIAGNOSIS — D126 Benign neoplasm of colon, unspecified: Secondary | ICD-10-CM | POA: Diagnosis not present

## 2016-12-12 MED ORDER — SOD PICOSULFATE-MAG OX-CIT ACD 10-3.5-12 MG-GM-GM PO PACK: 1.0000 | PACK | ORAL | 0 refills | Status: DC

## 2016-12-12 NOTE — Assessment & Plan Note (Signed)
Street of colon simple adenomas in 2012. Recommended 5 year repeat exam, he is currently 1 year overdue. We will proceed with colonoscopy as per recommendations. Of note the patient is interested in internal hemorrhoid banding if his hemorrhoids are amendable to banding.  Proceed with colonoscopy +/- hemorrhoid banding (if amendable) with Dr. Oneida Alar in the near future. The risks, benefits, and alternatives have been discussed in detail with the patient. They state understanding and desire to proceed.   The patient is not on any anticoagulants, anxiolytics, chronic pain medications, or antidepressants. Conscious sedation should be adequate for his procedure.

## 2016-12-12 NOTE — Patient Instructions (Signed)
1. We will schedule your procedure for you. 2. Return for follow-up based on postprocedure recommendations. 

## 2016-12-12 NOTE — Assessment & Plan Note (Signed)
The patient has a history of internal hemorrhoids. On his last colonoscopy they were inflamed and noted to be large. He is since had hemorrhoid excision surgery at [facility] with [facility]. He continues to have toilet tissue hematochezia about twice a month. If he still has internal hemorrhoids he is interested in banding if they are amenable to banding. We will proceed with colonoscopy as per below. Return for follow-up based on postprocedure recommendations.

## 2016-12-12 NOTE — Progress Notes (Addendum)
REVIEWED-NO ADDITIONAL RECOMMENDATIONS.  Referring Provider: Quentin Cornwall, MD Primary Care Physician:  Quentin Cornwall, MD Primary GI:  Dr. Oneida Alar  Chief Complaint  Patient presents with  . Colonoscopy    on recall  . Rectal Bleeding    when wipes occassionally, hx hemorrhoids  . Emesis    had one episode 3 wks ago with blood    HPI:   Ryan Christian is a 46 y.o. male who presents to schedule updated colonoscopy. Triage was attempted but aborted due to patient with complaints of occasional rectal bleeding and single episode of vomiting with blood-tinged emesis. Last colonoscopy completed 08/03/2011 for hematochezia which found two 3-6 mm sessile polyps in the ascending colon which were removed. Rare diverticula in the descending colon. Large internal hemorrhoids which appeared inflamed status post biopsy. Surgical pathology as per below. Recommended high-fiber diet, hemorrhoid cream, avoid heavy lifting, 5 year repeat colonoscopy.  Today he states he's doing well overall. He has had ongoing intermittent toilet tissue hematochezia. He has had a hemorrhoidectomy. Hematochezia has been chronic/ongoing. Sees toilet tissue hematochezia about twice a month. Occasionally will have spontaneous rectal bleeding into his garments. Denies abdominal pain, hemorrhoid symptoms, melena, persistent N/V. Did have an episode of emesis 3 weeks ago and noted some minimal/small amount of red blood. No more recurrence. Denies constipation. Denies chest pain, dyspnea, dizziness, lightheadedness, syncope, near syncope. Denies any other upper or lower GI symptoms.  He is interested in hemorrhoid banding during colonoscopy if he has internal hemorrhoids that are ammendable to banding.     Past Medical History:  Diagnosis Date  . GERD (gastroesophageal reflux disease)   . Hemorrhoids   . MRSA infection    right leg  . S/P colonoscopy 02/24/2007   Dr Wanda Plump hemorrhoids  . S/P colonoscopy 06/09/2008   Dr Ursula Alert Rashid-internal hemorrhoids  . S/P endoscopy 2006   Dr Patel-Hiatal hernia, otherwise normal  . Shingles 2016  . Spider bite wound    right lower leg, being treated @ wound care clinic in [place]    Past Surgical History:  Procedure Laterality Date  . COLONOSCOPY  08/03/2011   [id number] polyp,multiple in the ascending colon/mild diverticulosis in the descending colon/internal hemorrhoids,large/redundant sigmoid colon. TUBULAR ADENOMAS. Due for surveillance Sept 2017  . EXCISIONAL HEMORRHOIDECTOMY N/A 2015  . INGUINAL HERNIA REPAIR     [place], [place] Dr. Wyonia Hough  . INGUINAL HERNIA REPAIR  sept 2014    Current Outpatient Prescriptions  Medication Sig Dispense Refill  . esomeprazole (NEXIUM) 20 MG capsule Take 20 mg by mouth daily.    . NON FORMULARY Takes as needed (maybe once a month)    . sildenafil (VIAGRA) 25 MG tablet Take 25 mg by mouth as needed for erectile dysfunction.     No current facility-administered medications for this visit.     Allergies as of 12/12/2016  . (No Known Allergies)    Family History  Problem Relation Age of Onset  . Colon cancer Neg Hx     Social History   Social History  . Marital status: Divorced    Spouse name: N/A  . Number of children: 2  . Years of education: N/A   Occupational History  . Building maintenance   . [employer]    Social History Main Topics  . Smoking status: Never Smoker  . Smokeless tobacco: Never Used  . Alcohol use No  . Drug use: No  . Sexual activity: Not Asked   Other Topics Concern  .  None   Social History Narrative   married    Review of Systems: Complete ROS negative except as per HPI.   Physical Exam: BP 138/87   Pulse 85   Temp 97.9 F (36.6 C) (Oral)   Ht 6' (1.829 m)   Wt 222 lb (100.7 kg)   BMI 30.11 kg/m  General:   Alert and oriented. Pleasant and cooperative. Well-nourished and well-developed.  Head:  Normocephalic and atraumatic. Eyes:  Without icterus, sclera  clear and conjunctiva pink.  Ears:  Normal auditory acuity. Cardiovascular:  S1, S2 present without murmurs appreciated. Extremities without clubbing or edema. Respiratory:  Clear to auscultation bilaterally. No wheezes, rales, or rhonchi. No distress.  Gastrointestinal:  +BS, rounded but soft, non-tender and non-distended. No HSM noted. No guarding or rebound. No masses appreciated.  Rectal:  Deferred  Musculoskalatal:  Symmetrical without gross deformities. Neurologic:  Alert and oriented x4;  grossly normal neurologically. Psych:  Alert and cooperative. Normal mood and affect. Heme/Lymph/Immune: No excessive bruising noted.    12/12/2016 3:52 PM   Disclaimer: This note was dictated with voice recognition software. Similar sounding words can inadvertently be transcribed and may not be corrected upon review.

## 2016-12-12 NOTE — Assessment & Plan Note (Signed)
The patient has noted ongoing chronic, intermittent hematochezia. He has a history of hemorrhoids as per below. He is due for colonoscopy due to history of colonic adenomas. We will proceed as per below. Return for follow-up based on postprocedure recommendations.

## 2016-12-17 NOTE — Progress Notes (Signed)
cc'ed to pcp °

## 2016-12-31 ENCOUNTER — Encounter (HOSPITAL_COMMUNITY)
Admission: RE | Disposition: A | Discharge status: Home Health | Payer: Self-pay | Source: Ambulatory Visit | Attending: Gastroenterology

## 2016-12-31 ENCOUNTER — Ambulatory Visit (HOSPITAL_COMMUNITY)
Admission: RE | Admit: 2016-12-31 | Discharge: 2016-12-31 | Disposition: A | Discharge status: Home Health | Source: Ambulatory Visit | Attending: Gastroenterology | Admitting: Gastroenterology

## 2016-12-31 ENCOUNTER — Encounter (HOSPITAL_COMMUNITY): Payer: Self-pay

## 2016-12-31 DIAGNOSIS — K921 Melena: Secondary | ICD-10-CM

## 2016-12-31 DIAGNOSIS — D122 Benign neoplasm of ascending colon: Secondary | ICD-10-CM

## 2016-12-31 DIAGNOSIS — Z86010 Personal history of colonic polyps: Secondary | ICD-10-CM | POA: Diagnosis not present

## 2016-12-31 DIAGNOSIS — Z8614 Personal history of Methicillin resistant Staphylococcus aureus infection: Secondary | ICD-10-CM | POA: Insufficient documentation

## 2016-12-31 DIAGNOSIS — Z8719 Personal history of other diseases of the digestive system: Secondary | ICD-10-CM | POA: Insufficient documentation

## 2016-12-31 DIAGNOSIS — K219 Gastro-esophageal reflux disease without esophagitis: Secondary | ICD-10-CM | POA: Insufficient documentation

## 2016-12-31 DIAGNOSIS — K621 Rectal polyp: Secondary | ICD-10-CM | POA: Diagnosis not present

## 2016-12-31 DIAGNOSIS — K648 Other hemorrhoids: Secondary | ICD-10-CM | POA: Diagnosis not present

## 2016-12-31 DIAGNOSIS — K649 Unspecified hemorrhoids: Secondary | ICD-10-CM

## 2016-12-31 HISTORY — PX: HEMORRHOID BANDING: SHX5850

## 2016-12-31 HISTORY — PX: COLONOSCOPY: SHX5424

## 2016-12-31 SURGERY — COLONOSCOPY
Anesthesia: Moderate Sedation

## 2016-12-31 MED ORDER — STERILE WATER FOR IRRIGATION IR SOLN
Status: DC | PRN
  Administered 2016-12-31: 100 mL

## 2016-12-31 MED ORDER — ONDANSETRON 4 MG PO TBDP
ORAL_TABLET | ORAL | Status: AC
  Filled 2016-12-31: qty 1

## 2016-12-31 MED ORDER — MIDAZOLAM HCL 5 MG/5ML IJ SOLN
INTRAMUSCULAR | Status: DC | PRN
  Administered 2016-12-31 (×2): 2 mg via INTRAVENOUS

## 2016-12-31 MED ORDER — ONDANSETRON 4 MG PO TBDP
4.0000 mg | ORAL_TABLET | Freq: Once | ORAL | Status: AC
  Administered 2016-12-31: 4 mg via ORAL

## 2016-12-31 MED ORDER — SODIUM CHLORIDE 0.9 % IV SOLN
INTRAVENOUS | Status: DC
  Administered 2016-12-31: 13:00:00 via INTRAVENOUS

## 2016-12-31 MED ORDER — MEPERIDINE HCL 100 MG/ML IJ SOLN
INTRAMUSCULAR | Status: AC
  Filled 2016-12-31: qty 2

## 2016-12-31 MED ORDER — MIDAZOLAM HCL 5 MG/5ML IJ SOLN
INTRAMUSCULAR | Status: AC
  Filled 2016-12-31: qty 10

## 2016-12-31 MED ORDER — MEPERIDINE HCL 100 MG/ML IJ SOLN
INTRAMUSCULAR | Status: DC | PRN
  Administered 2016-12-31: 50 mg via INTRAVENOUS
  Administered 2016-12-31: 25 mg via INTRAVENOUS

## 2016-12-31 NOTE — Discharge Instructions (Signed)
You had internal hemorrhoids. YOU had THREE small POLYPS removed. I PLACED 3 BAND TO TREAT YOUR HEMORRHOIDAL BLEEDING. YOU MAY SEE SOME MILD BLEEDING OVER THE NEXT 3 TO 5 DAYS.   PLEASE CALL [phone] IF YOU HAVE A FEVER, A LARGE AMOUNT OF BLEEDING, OR DIFFICULTY URINATING.  DRINK WATER TO KEEP URINE LIGHT YELLOW.  YOU MAY USE NAPROXEN OR IBUPROFEN TWICE DAILY FOR RECTAL DISCOMFORT. USE TYLENOL AS NEEDED FOR ADDITIONAL PAIN RELIEF.  IF NEEDED, USE COLACE TWICE DAILY TO SOFTEN STOOL.  YOUR BIOPSY RESULTS WILL BE AVAILABLE IN MY CHART AFTER FEB 22 AND MY OFFICE WILL CONTACT YOU IN 10-14 DAYS WITH YOUR RESULTS.   No lifting greater than 20 lbs unless you have assistance for 7 days.  FOLLOW UP IN 6 MOS.  June 26, 2017 at 9:00AM  Next colonoscopy in 3-5 years.    Colonoscopy Care After Read the instructions outlined below and refer to this sheet in the next week. These discharge instructions provide you with general information on caring for yourself after you leave the hospital. While your treatment has been planned according to the most current medical practices available, unavoidable complications occasionally occur. If you have any problems or questions after discharge, call DR. Anthonee Gelin, [phone].  ACTIVITY  You may resume your regular activity, but move at a slower pace for the next 24 hours.   Take frequent rest periods for the next 24 hours.   Walking will help get rid of the air and reduce the bloated feeling in your belly (abdomen).   No driving for 24 hours (because of the medicine (anesthesia) used during the test).   You may shower.   Do not sign any important legal documents or operate any machinery for 24 hours (because of the anesthesia used during the test).    NUTRITION  Drink plenty of fluids.   You may resume your normal diet as instructed by your doctor.   Begin with a light meal and progress to your normal diet. Heavy or fried foods are harder to  digest and may make you feel sick to your stomach (nauseated).   Avoid alcoholic beverages for 24 hours or as instructed.    MEDICATIONS  You may resume your normal medications.   WHAT YOU CAN EXPECT TODAY  Some feelings of bloating in the abdomen.   Passage of more gas than usual.   Spotting of blood in your stool or on the toilet paper  .  IF YOU HAD POLYPS REMOVED DURING THE COLONOSCOPY:  Eat a soft diet IF YOU HAVE NAUSEA, BLOATING, ABDOMINAL PAIN, OR VOMITING.    FINDING OUT THE RESULTS OF YOUR TEST Not all test results are available during your visit. DR. Oneida Alar WILL CALL YOU WITHIN 14 DAYS OF YOUR PROCEDUE WITH YOUR RESULTS. Do not assume everything is normal if you have not heard from DR. Fountain Derusha, CALL HER OFFICE AT [phone].  SEEK IMMEDIATE MEDICAL ATTENTION AND CALL THE OFFICE: [phone] IF:  You have more than a spotting of blood in your stool.   Your belly is swollen (abdominal distention).   You are nauseated or vomiting.   You have a temperature over 101F.   You have abdominal pain or discomfort that is severe or gets worse throughout the day.   HEMORRHOIDAL BANDING COMPLICATIONS:  COMMON: 1. MINOR PAIN  UNCOMMON: 1. ABSCESS 2. BAND FALLS OFF 3. PROLAPSE OF HEMORRHOIDS AND PAIN 4. ULCER BLEEDING  A. USUALLY SELF-LIMITED: MAY LAST 3-5 DAYS  B. MAY REQUIRE  INTERVENTION: 1-2 WEEKS AFTER INTERACTIONS 5. NECROTIZING PELVIC SEPSIS  A. SYMPTOMS: FEVER, PAIN, DIFFICULTY URINATING   Hemorrhoids Hemorrhoids are dilated (enlarged) veins around the rectum. Sometimes clots will form in the veins. This makes them swollen and painful. These are called thrombosed hemorrhoids. Causes of hemorrhoids include:  Constipation.   Straining to have a bowel movement.   HEAVY LIFTING  HOME CARE INSTRUCTIONS  Eat a well balanced diet and drink 6 to 8 glasses of water every day to avoid constipation. You may also use a bulk laxative.   Avoid straining to  have bowel movements.   Keep anal area dry and clean.   Do not use a donut shaped pillow or sit on the toilet for long periods. This increases blood pooling and pain.   Move your bowels when your body has the urge; this will require less straining and will decrease pain and pressure.  Polyps, Colon  A polyp is extra tissue that grows inside your body. Colon polyps grow in the large intestine. The large intestine, also called the colon, is part of your digestive system. It is a long, hollow tube at the end of your digestive tract where your body makes and stores stool.Most polyps are not dangerous. They are benign. This means they are not cancerous. But over time, some types of polyps can turn into cancer. Polyps that are smaller than a pea are usually not harmful. But larger polyps could someday become or may already be cancerous. To be safe, doctors remove all polyps and test them.   WHO GETS POLYPS? Anyone can get polyps, but certain people are more likely than others. You may have a greater chance of getting polyps if:  You are over 50.   You have had polyps before.   Someone in your family has had polyps.   Someone in your family has had cancer of the large intestine.   Find out if someone in your family has had polyps. You may also be more likely to get polyps if you:   Eat a lot of fatty foods   Smoke   Drink alcohol   Do not exercise  Eat too much    PREVENTION There is not one sure way to prevent polyps. You might be able to lower your risk of getting them if you:  Eat more fruits and vegetables and less fatty food.   Do not smoke.   Avoid alcohol.   Exercise every day.   Lose weight if you are overweight.   Eating more calcium and folate can also lower your risk of getting polyps. Some foods that are rich in calcium are milk, cheese, and broccoli. Some foods that are rich in folate are chickpeas, kidney beans, and spinach.

## 2016-12-31 NOTE — Op Note (Signed)
[facility] Patient Name: Ryan Christian Procedure Date: 12/31/2016 2:07 PM MRN: [id number] Date of Birth: 10-Dec-1970 Attending MD: Barney Drain , MD CSN: [id number] Age: 46 Admit Type: Outpatient Procedure:                Colonoscopy WITH COLD FORCEPS/SNARE Indications:              Hematochezia Providers:                Barney Drain, MD, Rosina Lowenstein, RN, Aram Candela Referring MD:             Moshe Cipro Medicines:                Meperidine 75 mg IV, Midazolam 4 mg IV Complications:            No immediate complications. Estimated Blood Loss:     Estimated blood loss was minimal. Procedure:                Pre-Anesthesia Assessment:                           - Prior to the procedure, a History and Physical                            was performed, and patient medications and                            allergies were reviewed. The patient's tolerance of                            previous anesthesia was also reviewed. The risks                            and benefits of the procedure and the sedation                            options and risks were discussed with the patient.                            All questions were answered, and informed consent                            was obtained. Prior Anticoagulants: The patient has                            taken no previous anticoagulant or antiplatelet                            agents. ASA Grade Assessment: I - A normal, healthy                            patient. After reviewing the risks and benefits,                            the patient was deemed in satisfactory condition to  undergo the procedure. After obtaining informed                            consent, the colonoscope was passed under direct                            vision. Throughout the procedure, the patient's                            blood pressure, pulse, and oxygen saturations were                            monitored  continuously. The [id number]                            scope was introduced through the anus and advanced                            to the the cecum, identified by appendiceal orifice                            and ileocecal valve. The [id number] scope                            was introduced through the anus and advanced to the                            3 cm into the ileum. The terminal ileum, ileocecal                            valve, appendiceal orifice, and rectum were                            photographed. The colonoscopy was somewhat                            difficult due to significant looping. Successful                            completion of the procedure was aided by COLOWRAP.                            The patient tolerated the procedure fairly well.                            The quality of the bowel preparation was excellent. Scope In: 2:59:26 PM Scope Out: 3:24:46 PM Scope Withdrawal Time: 0 hours 21 minutes 41 seconds  Total Procedure Duration: 0 hours 25 minutes 20 seconds  Findings:      The terminal ileum appeared normal.      Two sessile polyps were found in the rectum and ascending colon. The       polyps were 5 to 6 mm in size. These polyps were removed with a hot  snare. Resection and retrieval were complete.      A 4 mm polyp was found in the rectum. The polyp was sessile. The polyp       was removed with a cold biopsy forceps. The polyp was removed with a       cold snare. Resection and retrieval were complete.      Internal hemorrhoids were found during retroflexion. The hemorrhoids       were large. Three bands were successfully placed. Bleeding had stopped       at the end of the maneuver. Estimated blood loss was minimal. Impression:               - The examined portion of the ileum was normal.                           - Two 5 to 6 mm polyps in the rectum and in the                            ascending colon, removed with a  hot snare. Resected                            and retrieved.                           - One 4 mm polyp in the rectum, removed with a cold                            snare and removed with a cold biopsy forceps.                            Resected and retrieved.                           - Internal hemorrhoids. Banded x3. Moderate Sedation:      Moderate (conscious) sedation was administered by the endoscopy nurse       and supervised by the endoscopist. The following parameters were       monitored: oxygen saturation, heart rate, blood pressure, and response       to care. Total physician intraservice time was 41 minutes. Recommendation:           - High fiber diet.                           - Continue present medications.                           - Await pathology results.                           - Repeat colonoscopy in 3 - 5 years for                            surveillance.                           - Return to GI office in 6 months.                           -  Patient has a contact number available for                            emergencies. The signs and symptoms of potential                            delayed complications were discussed with the                            patient. Return to normal activities tomorrow.                            Written discharge instructions were provided to the                            patient. Procedure Code(s):        --- Professional ---                           [id number], Colonoscopy, flexible; with removal of                            tumor(s), polyp(s), or other lesion(s) by snare                            technique                           [phone], Colonoscopy, flexible; with band ligation(s)                            (eg, hemorrhoids)                           99152, Moderate sedation services provided by the                            same physician or other qualified health care                            professional performing  the diagnostic or                            therapeutic service that the sedation supports,                            requiring the presence of an independent trained                            observer to assist in the monitoring of the                            patient's level of consciousness and physiological                            status; initial  15 minutes of intraservice time,                            patient age 81 years or older                           [phone], Moderate sedation services; each additional                            15 minutes intraservice time                           [phone], Moderate sedation services; each additional                            15 minutes intraservice time Diagnosis Code(s):        --- Professional ---                           K64.8, Other hemorrhoids                           K62.1, Rectal polyp                           D12.2, Benign neoplasm of ascending colon                           K92.1, Melena (includes Hematochezia) CPT copyright 2016 American [employer] Association. All rights reserved. The codes documented in this report are preliminary and upon coder review may  be revised to meet current compliance requirements. Barney Drain, MD Barney Drain, MD 12/31/2016 3:53:16 PM This report has been signed electronically. Number of Addenda: 0

## 2016-12-31 NOTE — H&P (Signed)
  Primary Care Physician:  [facility] Primary Gastroenterologist:  Dr. Oneida Alar  Pre-Procedure History & Physical: HPI:  Ryan Christian is a 46 y.o. male here for RECTAL BLEEDING/ PERSONAL HISTORY OF POLYPS.  Past Medical History:  Diagnosis Date  . GERD (gastroesophageal reflux disease)   . Hemorrhoids   . MRSA infection    right leg  . S/P colonoscopy 02/24/2007   Dr Wanda Plump hemorrhoids  . S/P colonoscopy 06/09/2008   Dr Ursula Alert Rashid-internal hemorrhoids  . S/P endoscopy 2006   Dr Patel-Hiatal hernia, otherwise normal  . Shingles 2016  . Spider bite wound    right lower leg, being treated @ wound care clinic in [place]    Past Surgical History:  Procedure Laterality Date  . COLONOSCOPY  08/03/2011   [id number] polyp,multiple in the ascending colon/mild diverticulosis in the descending colon/internal hemorrhoids,large/redundant sigmoid colon. TUBULAR ADENOMAS. Due for surveillance Sept 2017  . EXCISIONAL HEMORRHOIDECTOMY N/A 2015  . INGUINAL HERNIA REPAIR     [place], [place] Dr. Wyonia Hough  . INGUINAL HERNIA REPAIR  sept 2014    Prior to Admission medications   Medication Sig Start Date End Date Taking? Authorizing Provider  Ascorbic Acid (VITAMIN C PO) Take 1 tablet by mouth daily.   Yes Historical Provider, MD  esomeprazole (NEXIUM) 20 MG capsule Take 20 mg by mouth daily.   Yes Historical Provider, MD  sildenafil (VIAGRA) 25 MG tablet Take 25 mg by mouth as needed for erectile dysfunction.   Yes Historical Provider, MD  Sod Picosulfate-Mag Ox-Cit Acd 10-3.5-12 MG-GM-GM PACK Take 1 Container by mouth as directed. 12/12/16  Yes Danie Binder, MD    Allergies as of 12/12/2016  . (No Known Allergies)    Family History  Problem Relation Age of Onset  . Colon cancer Neg Hx     Social History   Social History  . Marital status: Divorced    Spouse name: N/A  . Number of children: 2  . Years of education: N/A   Occupational History  . Building  maintenance   . [employer]    Social History Main Topics  . Smoking status: Never Smoker  . Smokeless tobacco: Never Used  . Alcohol use No  . Drug use: No  . Sexual activity: Not on file   Other Topics Concern  . Not on file   Social History Narrative   married    Review of Systems: See HPI, otherwise negative ROS   Physical Exam: BP 121/78   Pulse (!) 59   Temp 98.5 F (36.9 C) (Oral)   Resp 16   SpO2 98%  General:   Alert,  pleasant and cooperative in NAD Head:  Normocephalic and atraumatic. Neck:  Supple; Lungs:  Clear throughout to auscultation.    Heart:  Regular rate and rhythm. Abdomen:  Soft, nontender and nondistended. Normal bowel sounds, without guarding, and without rebound.   Neurologic:  Alert and  oriented x4;  grossly normal neurologically.  Impression/Plan:    RECTAL BLEEDING  PLAN:  1. TCS/? Hemorrhoid banding TODAY. DISCUSSED PROCEDURE, BENEFITS, & RISKS: < 1% chance of medication reaction, PELVIC VEIN SEPSIS, bleeding, perforation, or rupture of spleen/liver.

## 2016-12-31 NOTE — Progress Notes (Addendum)
Patient feeling nauseated. Given Zofran at 1600. Ice chips at 1625. Started drinking at 1640. Got dressed at 1655. Tried to urinate at 1650. Unable to urinate. No pain noted. Called Dr. Oneida Alar. Returned my call at 1655. Told patient if not urinated in two to three hours to call her. Gave patient and friend the number to call [phone]. Verbalized understanding. Nausea resolved. Patient stable with no pain upon leaving post-op area.

## 2017-01-10 ENCOUNTER — Encounter (HOSPITAL_COMMUNITY): Payer: Self-pay | Admitting: Gastroenterology

## 2017-01-13 ENCOUNTER — Telehealth: Payer: Self-pay | Admitting: Gastroenterology

## 2017-01-13 NOTE — Telephone Encounter (Signed)
Please call pt. HE had ONE simple adenoma AND TWO HYPERPLASTIC POLYPS removed.   DRINK WATER TO KEEP URINE LIGHT YELLOW.  FOLLOW A HIGH FIBER DIET.  FOLLOW UP  June 26, 2017 at 9:00AM  Next colonoscopy in 5 years.

## 2017-01-14 NOTE — Telephone Encounter (Signed)
Reminder in epic °

## 2017-01-15 NOTE — Telephone Encounter (Signed)
Patient made aware of results, f/u appt and SLFs recommendations

## 2017-03-19 ENCOUNTER — Encounter: Payer: Self-pay | Admitting: Gastroenterology

## 2017-06-26 ENCOUNTER — Ambulatory Visit: Admitting: Gastroenterology

## 2017-07-04 ENCOUNTER — Encounter: Payer: Self-pay | Admitting: Gastroenterology

## 2017-07-04 ENCOUNTER — Ambulatory Visit (INDEPENDENT_AMBULATORY_CARE_PROVIDER_SITE_OTHER): Admitting: Gastroenterology

## 2017-07-04 DIAGNOSIS — K642 Third degree hemorrhoids: Secondary | ICD-10-CM

## 2017-07-04 DIAGNOSIS — K219 Gastro-esophageal reflux disease without esophagitis: Secondary | ICD-10-CM

## 2017-07-04 DIAGNOSIS — R1032 Left lower quadrant pain: Secondary | ICD-10-CM

## 2017-07-04 NOTE — Progress Notes (Signed)
   Subjective:    Patient ID: Ryan Christian, male    DOB: 23-Jul-1971, 46 y.o.   MRN: [id number]  HPI  CHANGE IN BOWEL HABITS-Thinks he LACTOSE INTOLERANCE. MON ATE CALZONE/ICE CREAM AND TUES HAD MILD LOWER CRAMPS/THE RUNS. 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 SAW A LITTLE BLOOD THIS AM. TAKING TYLENOL FOR NECK PAIN. HEMORRHOIDS DOING WELL.  PT DENIES FEVER, CHILLS, HEMATEMESIS, nausea, vomiting, melena, CHEST PAIN, SHORTNESS OF BREATH, constipation, problems swallowing, problems with sedation, OR heartburn or indigestion.  Past Surgical History:  Procedure Laterality Date  . COLONOSCOPY  08/03/2011   [id number] polyp,multiple in the ascending colon/mild diverticulosis in the descending colon/internal hemorrhoids,large/redundant sigmoid colon. TUBULAR ADENOMAS. Due for surveillance Sept 2017  . COLONOSCOPY N/A 12/31/2016   Procedure: COLONOSCOPY;  Surgeon: Danie Binder, MD;  [facility];  Service: Endoscopy;  Laterality: N/A;  1015 - moved to 2/19 @ 2:15 per office  . EXCISIONAL HEMORRHOIDECTOMY N/A 2015  . HEMORRHOID BANDING N/A 12/31/2016   Procedure: HEMORRHOID BANDING;  Surgeon: Danie Binder, MD;  [facility]: [facility];  Service: Endoscopy;  Laterality: N/A;  . INGUINAL HERNIA REPAIR     [place], [place] Dr. Wyonia Hough  . INGUINAL HERNIA REPAIR  sept 2014    No Known Allergies  Current Outpatient Prescriptions  Medication Sig Dispense Refill  . Ascorbic Acid (VITAMIN C PO) Take 1 tablet by mouth daily.    Marland Kitchen esomeprazole (NEXIUM) 20 MG capsule Take 20 mg by mouth daily.    . sildenafil (VIAGRA) 25 MG tablet Take 25 mg by mouth as needed for erectile dysfunction.     Review of Systems PER HPI OTHERWISE ALL SYSTEMS ARE NEGATIVE.    Objective:   Physical Exam  Constitutional: He is oriented to person, place, and time. He appears well-developed and well-nourished. No distress.  HENT:  Head: Normocephalic and atraumatic.  Mouth/Throat: Oropharynx is clear and moist. No  oropharyngeal exudate.  Eyes: Pupils are equal, round, and reactive to light. No scleral icterus.  Neck: Normal range of motion. Neck supple.  Cardiovascular: Normal rate, regular rhythm and normal heart sounds.   Pulmonary/Chest: Effort normal and breath sounds normal. No respiratory distress.  Abdominal: Soft. Bowel sounds are normal. He exhibits no distension. There is no tenderness.  Lymphadenopathy:    He has no cervical adenopathy.  Neurological: He is alert and oriented to person, place, and time.  NO FOCAL DEFICITS  Psychiatric: He has a normal mood and affect.  Vitals reviewed.     Assessment & Plan:

## 2017-07-04 NOTE — Assessment & Plan Note (Signed)
DUE TO LACTOSE INTOLERANCE.  AVOID DAIRY OR IF YOU CONSUME DAIRY, ADD LACTASE 3 PILLS WITH MEALS UP TO THREE TIMES A DAY. CONTINUE TO MONITOR SYMPTOMS.

## 2017-07-04 NOTE — Progress Notes (Signed)
No pcp per patient 

## 2017-07-04 NOTE — Assessment & Plan Note (Signed)
SYMPTOMS FAIRLY WELL CONTROLLED.  DRINK WATER TO KEEP YOUR URINE LIGHT YELLOW. FOLLOW A HIGH FIBER DIET.  HANDOUT GIVEN. USE PREPARATION H FOUR TIMES  A DAY IF NEEDED TO RELIEVE RECTAL PAIN/PRESSURE/BLEEDING. CALL WITH QUESTIONS OR CONCERNS.  FOLLOW UP IN 1 YEAR.

## 2017-07-04 NOTE — Assessment & Plan Note (Signed)
SYMPTOMS CONTROLLED/RESOLVED.  CONTINUE TO MONITOR SYMPTOMS. 

## 2017-07-04 NOTE — Patient Instructions (Addendum)
DRINK WATER TO KEEP YOUR URINE LIGHT YELLOW.  FOLLOW A HIGH FIBER DIET. SEE INFO BELOW.  USE PREPARATION H FOUR TIMES  A DAY IF NEEDED TO RELIEVE RECTAL PAIN/PRESSURE/BLEEDING.   TO PREVENT DIARRHEA, EITHER AVOID DAIRY OR IF YOU CONSUME DAIRY, ADD LACTASE 3 PILLS WITH MEALS UP TO THREE TIMES A DAY.  PLEASE CALL WITH QUESTIONS OR CONCERNS.  FOLLOW UP IN 1 YEAR.

## 2018-05-13 ENCOUNTER — Encounter: Payer: Self-pay | Admitting: Gastroenterology

## 2022-01-18 ENCOUNTER — Encounter: Payer: Self-pay | Admitting: *Deleted

## 2022-12-26 ENCOUNTER — Ambulatory Visit: Admitting: Internal Medicine

## 2023-02-05 ENCOUNTER — Encounter: Payer: Self-pay | Admitting: *Deleted

## 2023-02-11 ENCOUNTER — Encounter: Payer: Self-pay | Admitting: Internal Medicine

## 2023-03-01 NOTE — H&P (View-Only) (Signed)
 Referring Provider:Dr. Mahoney Primary Care Physician:  Winikur, Melinda, FNP Primary Gastroenterologist:  Dr. ***  No chief complaint on file.   HPI:   Ryan Christian is a 51 y.o. male presenting today at the request of Dr. Mahoney for liver spots.    Today:   He also has history of adenomatous colon polyps, currently overdue for surveillance colonoscopy.   Last colonoscopy 12/31/2016 with two 5-6 mm polyps in the rectum removed, one 4 mm polyp in the rectum removed, internal hemorrhoids banded x 3.  Pathology with tubular adenoma and hyperplastic polyp.  Recommended 5-year surveillance.   Past Medical History:  Diagnosis Date   GERD (gastroesophageal reflux disease)    Hemorrhoids    MRSA infection    right leg   S/P colonoscopy 02/24/2007   Dr Pandya-internal hemorrhoids   S/P colonoscopy 06/09/2008   Dr Zahid Rashid-internal hemorrhoids   S/P endoscopy 2006   Dr Patel-Hiatal hernia, otherwise normal   Shingles 2016   Spider bite wound    right lower leg, being treated @ wound care clinic in [place]    Past Surgical History:  Procedure Laterality Date   COLONOSCOPY  08/03/2011   SLF:sessile polyp,multiple in the ascending colon/mild diverticulosis in the descending colon/internal hemorrhoids,large/redundant sigmoid colon. TUBULAR ADENOMAS. Due for surveillance Sept 2017   COLONOSCOPY N/A 12/31/2016   Procedure: COLONOSCOPY;  Surgeon: Sandi L Fields, MD;  [facility];  Service: Endoscopy;  Laterality: N/A;  1015 - moved to 2/19 @ 2:15 per office   EXCISIONAL HEMORRHOIDECTOMY N/A 2015   HEMORRHOID BANDING N/A 12/31/2016   Procedure: HEMORRHOID BANDING;  Surgeon: Sandi L Fields, MD;  [facility];  Service: Endoscopy;  Laterality: N/A;   INGUINAL HERNIA REPAIR     [place], [place] Dr. [place]   INGUINAL HERNIA REPAIR  sept 2014    Current Outpatient Medications  Medication Sig Dispense Refill   Ascorbic Acid (VITAMIN C PO) Take 1 tablet by mouth  daily.     esomeprazole (NEXIUM) 20 MG capsule Take 20 mg by mouth daily.     sildenafil (VIAGRA) 25 MG tablet Take 25 mg by mouth as needed for erectile dysfunction.     No current facility-administered medications for this visit.    Allergies as of 03/04/2023   (No Known Allergies)    Family History  Problem Relation Age of Onset   Colon cancer Neg Hx     Social History   Socioeconomic History   Marital status: Divorced    Spouse name: Not on file   Number of children: 2   Years of education: Not on file   Highest education level: Not on file  Occupational History   Occupation: Building maintenance   Occupation: [employer]  Tobacco Use   Smoking status: Never   Smokeless tobacco: Never  Substance and Sexual Activity   Alcohol use: No   Drug use: No   Sexual activity: Not on file  Other Topics Concern   Not on file  Social History Narrative   married   Social Determinants of Health   Financial Resource Strain: Not on file  Food Insecurity: Not on file  Transportation Needs: Not on file  Physical Activity: Not on file  Stress: Not on file  Social Connections: Not on file  Intimate Partner Violence: Not on file    Review of Systems: Gen: Denies any fever, chills, fatigue, weight loss, lack of appetite.  CV: Denies chest pain, heart palpitations, peripheral edema,   syncope.  Resp: Denies shortness of breath at rest or with exertion. Denies wheezing or cough.  GI: Denies dysphagia or odynophagia. Denies jaundice, hematemesis, fecal incontinence. GU : Denies urinary burning, urinary frequency, urinary hesitancy MS: Denies joint pain, muscle weakness, cramps, or limitation of movement.  Derm: Denies rash, itching, dry skin Psych: Denies depression, anxiety, memory loss, and confusion Heme: Denies bruising, bleeding, and enlarged lymph nodes.  Physical Exam: There were no vitals taken for this visit. General:   Alert and oriented. Pleasant and cooperative.  Well-nourished and well-developed.  Head:  Normocephalic and atraumatic. Eyes:  Without icterus, sclera clear and conjunctiva pink.  Ears:  Normal auditory acuity. Lungs:  Clear to auscultation bilaterally. No wheezes, rales, or rhonchi. No distress.  Heart:  S1, S2 present without murmurs appreciated.  Abdomen:  +BS, soft, non-tender and non-distended. No HSM noted. No guarding or rebound. No masses appreciated.  Rectal:  Deferred  Msk:  Symmetrical without gross deformities. Normal posture. Extremities:  Without edema. Neurologic:  Alert and  oriented x4;  grossly normal neurologically. Skin:  Intact without significant lesions or rashes. Psych:  Alert and cooperative. Normal mood and affect.    Assessment:     Plan:  ***   Shaqueena Mauceri, PA-C [facility] 03/04/2023   

## 2023-03-01 NOTE — Progress Notes (Unsigned)
Referring Provider:Dr. Leary Roca Primary Care Physician:  Stoney Bang, FNP Primary Gastroenterologist:  Dr. Bonnetta Barry chief complaint on file.   HPI:   Ryan Christian is a 52 y.o. male presenting today at the request of Dr. Leary Roca for liver spots.    Today:   He also has history of adenomatous colon polyps, currently overdue for surveillance colonoscopy.   Last colonoscopy 12/31/2016 with two 5-6 mm polyps in the rectum removed, one 4 mm polyp in the rectum removed, internal hemorrhoids banded x 3.  Pathology with tubular adenoma and hyperplastic polyp.  Recommended 5-year surveillance.   Past Medical History:  Diagnosis Date   GERD (gastroesophageal reflux disease)    Hemorrhoids    MRSA infection    right leg   S/P colonoscopy 02/24/2007   Dr Cyndia Skeeters hemorrhoids   S/P colonoscopy 06/09/2008   Dr Isaiah Serge Rashid-internal hemorrhoids   S/P endoscopy 2006   Dr Patel-Hiatal hernia, otherwise normal   Shingles 2016   Spider bite wound    right lower leg, being treated @ wound care clinic in [place]    Past Surgical History:  Procedure Laterality Date   COLONOSCOPY  08/03/2011   ZOX:WRUEAVW polyp,multiple in the ascending colon/mild diverticulosis in the descending colon/internal hemorrhoids,large/redundant sigmoid colon. TUBULAR ADENOMAS. Due for surveillance Sept 2017   COLONOSCOPY N/A 12/31/2016   Procedure: COLONOSCOPY;  Surgeon: West Bali, MD;  Location: [facility];  Service: Endoscopy;  Laterality: N/A;  1015 - moved to 2/19 @ 2:15 per office   EXCISIONAL HEMORRHOIDECTOMY N/A 2015   HEMORRHOID BANDING N/A 12/31/2016   Procedure: HEMORRHOID BANDING;  Surgeon: West Bali, MD;  Location: [facility];  Service: Endoscopy;  Laterality: N/A;   INGUINAL HERNIA REPAIR     [place], [place] Dr. Cameron Proud   INGUINAL HERNIA REPAIR  sept 2014    Current Outpatient Medications  Medication Sig Dispense Refill   Ascorbic Acid (VITAMIN C PO) Take 1 tablet by mouth  daily.     esomeprazole (NEXIUM) 20 MG capsule Take 20 mg by mouth daily.     sildenafil (VIAGRA) 25 MG tablet Take 25 mg by mouth as needed for erectile dysfunction.     No current facility-administered medications for this visit.    Allergies as of 03/04/2023   (No Known Allergies)    Family History  Problem Relation Age of Onset   Colon cancer Neg Hx     Social History   Socioeconomic History   Marital status: Divorced    Spouse name: Not on file   Number of children: 2   Years of education: Not on file   Highest education level: Not on file  Occupational History   Occupation: Building maintenance   Occupation: Airline pilot  Tobacco Use   Smoking status: Never   Smokeless tobacco: Never  Substance and Sexual Activity   Alcohol use: No   Drug use: No   Sexual activity: Not on file  Other Topics Concern   Not on file  Social History Narrative   married   Social Determinants of Health   Financial Resource Strain: Not on file  Food Insecurity: Not on file  Transportation Needs: Not on file  Physical Activity: Not on file  Stress: Not on file  Social Connections: Not on file  Intimate Partner Violence: Not on file    Review of Systems: Gen: Denies any fever, chills, fatigue, weight loss, lack of appetite.  CV: Denies chest pain, heart palpitations, peripheral edema,  syncope.  Resp: Denies shortness of breath at rest or with exertion. Denies wheezing or cough.  GI: Denies dysphagia or odynophagia. Denies jaundice, hematemesis, fecal incontinence. GU : Denies urinary burning, urinary frequency, urinary hesitancy MS: Denies joint pain, muscle weakness, cramps, or limitation of movement.  Derm: Denies rash, itching, dry skin Psych: Denies depression, anxiety, memory loss, and confusion Heme: Denies bruising, bleeding, and enlarged lymph nodes.  Physical Exam: There were no vitals taken for this visit. General:   Alert and oriented. Pleasant and cooperative.  Well-nourished and well-developed.  Head:  Normocephalic and atraumatic. Eyes:  Without icterus, sclera clear and conjunctiva pink.  Ears:  Normal auditory acuity. Lungs:  Clear to auscultation bilaterally. No wheezes, rales, or rhonchi. No distress.  Heart:  S1, S2 present without murmurs appreciated.  Abdomen:  +BS, soft, non-tender and non-distended. No HSM noted. No guarding or rebound. No masses appreciated.  Rectal:  Deferred  Msk:  Symmetrical without gross deformities. Normal posture. Extremities:  Without edema. Neurologic:  Alert and  oriented x4;  grossly normal neurologically. Skin:  Intact without significant lesions or rashes. Psych:  Alert and cooperative. Normal mood and affect.    Assessment:     Plan:  ***   Ermalinda Memos, PA-C [facility] 03/04/2023

## 2023-03-04 ENCOUNTER — Ambulatory Visit (INDEPENDENT_AMBULATORY_CARE_PROVIDER_SITE_OTHER): Admitting: Gastroenterology

## 2023-03-04 ENCOUNTER — Encounter: Payer: Self-pay | Admitting: Gastroenterology

## 2023-03-04 ENCOUNTER — Other Ambulatory Visit: Payer: Self-pay | Admitting: *Deleted

## 2023-03-04 ENCOUNTER — Encounter: Payer: Self-pay | Admitting: *Deleted

## 2023-03-04 VITALS — BP 139/85 | HR 64 | Temp 98.1°F | Ht 73.0 in | Wt 223.4 lb

## 2023-03-04 DIAGNOSIS — K649 Unspecified hemorrhoids: Secondary | ICD-10-CM | POA: Diagnosis not present

## 2023-03-04 DIAGNOSIS — K769 Liver disease, unspecified: Secondary | ICD-10-CM | POA: Diagnosis not present

## 2023-03-04 DIAGNOSIS — K625 Hemorrhage of anus and rectum: Secondary | ICD-10-CM

## 2023-03-04 DIAGNOSIS — K219 Gastro-esophageal reflux disease without esophagitis: Secondary | ICD-10-CM

## 2023-03-04 DIAGNOSIS — Z860100 Personal history of colon polyps, unspecified: Secondary | ICD-10-CM | POA: Insufficient documentation

## 2023-03-04 MED ORDER — PEG 3350-KCL-NA BICARB-NACL 420 G PO SOLR: 4000.0000 mL | Freq: Once | ORAL | 0 refills | Status: DC

## 2023-03-04 MED ORDER — HYDROCORTISONE (PERIANAL) 2.5 % EX CREA: 1.0000 | TOPICAL_CREAM | Freq: Two times a day (BID) | CUTANEOUS | 1 refills | Status: AC

## 2023-03-04 NOTE — Patient Instructions (Addendum)
I am requesting prior CT from your primary care provider.  I will let you know if any further workup is needed of reported liver lesions.  We will arrange you to have a colonoscopy and upper endoscopy in the near future with Dr. Marletta Lor.  Use Anusol rectal cream twice daily for 5-7 days as needed for rectal bleeding.  I have sent a prescription to your pharmacy.  Continue to avoid BC powders and NSAIDs in general including ibuprofen, Aleve, Advil, and anything that says "NSAID" on the package.  Use Tylenol first for pain.  No more than 3000 mg of Tylenol per 24 hours.  Continue taking Nexium 20 mg every day.  Will follow-up with you after your procedures for possible hemorrhoid banding.  Ermalinda Memos, PA-C [facility]

## 2023-03-05 ENCOUNTER — Encounter: Payer: Self-pay | Admitting: Gastroenterology

## 2023-03-10 ENCOUNTER — Telehealth: Payer: Self-pay | Admitting: Gastroenterology

## 2023-03-10 NOTE — Telephone Encounter (Signed)
Received recent labs from PCP dated 01/31/2023.  Hemoglobin within normal limits at 14.8.  Alk phos minimally elevated at 133, otherwise LFTs normal.  No imaging included.   I had requested recent CT results as patient reported a CT that showed liver spots and we received a referral for liver spots, but no imaging was included in the referral.   Toni Amend, can you reach out to Dr. Thomes Lolling office to determine what imaging patient had that showed "liver spots" as we received a referral for this, but no imaging. I need a copy of what ever imaging showed the "liver spots".

## 2023-03-18 ENCOUNTER — Encounter: Payer: Self-pay | Admitting: *Deleted

## 2023-03-18 ENCOUNTER — Other Ambulatory Visit: Payer: Self-pay | Admitting: *Deleted

## 2023-03-18 MED ORDER — PEG 3350-KCL-NA BICARB-NACL 420 G PO SOLR: 4000.0000 mL | Freq: Once | ORAL | 0 refills | Status: AC

## 2023-03-18 NOTE — Telephone Encounter (Signed)
Noted. You can try calling again tomorrow. If you are not able to reach him, please send a letter requesting him to call us.

## 2023-03-18 NOTE — Telephone Encounter (Signed)
Spoke to Dr.Mahoney's nurse, she states she did not see any imaging in his chart. I have tried to call the pt, several time but no answer.

## 2023-03-20 NOTE — Telephone Encounter (Signed)
Noted  

## 2023-03-20 NOTE — Telephone Encounter (Signed)
Spoke to pt, he informed me that he had imaging done at [facility]. He states he will go by there and get a copy of scan and bring to office one day next week.

## 2023-03-26 ENCOUNTER — Ambulatory Visit (HOSPITAL_COMMUNITY)
Admission: RE | Admit: 2023-03-26 | Discharge: 2023-03-26 | Disposition: A | Discharge status: Home / Self Care | Attending: Internal Medicine | Admitting: Internal Medicine

## 2023-03-26 ENCOUNTER — Encounter (HOSPITAL_COMMUNITY)
Admission: RE | Disposition: A | Discharge status: Home / Self Care | Payer: Self-pay | Source: Home / Self Care | Attending: Internal Medicine

## 2023-03-26 ENCOUNTER — Ambulatory Visit (HOSPITAL_BASED_OUTPATIENT_CLINIC_OR_DEPARTMENT_OTHER): Admitting: Anesthesiology

## 2023-03-26 ENCOUNTER — Encounter (HOSPITAL_COMMUNITY): Payer: Self-pay

## 2023-03-26 ENCOUNTER — Encounter: Payer: Self-pay | Admitting: *Deleted

## 2023-03-26 ENCOUNTER — Other Ambulatory Visit: Payer: Self-pay

## 2023-03-26 ENCOUNTER — Ambulatory Visit (HOSPITAL_COMMUNITY): Admitting: Anesthesiology

## 2023-03-26 DIAGNOSIS — K5730 Diverticulosis of large intestine without perforation or abscess without bleeding: Secondary | ICD-10-CM

## 2023-03-26 DIAGNOSIS — K2970 Gastritis, unspecified, without bleeding: Secondary | ICD-10-CM | POA: Diagnosis not present

## 2023-03-26 DIAGNOSIS — Z79899 Other long term (current) drug therapy: Secondary | ICD-10-CM | POA: Insufficient documentation

## 2023-03-26 DIAGNOSIS — K219 Gastro-esophageal reflux disease without esophagitis: Secondary | ICD-10-CM | POA: Insufficient documentation

## 2023-03-26 DIAGNOSIS — Z8614 Personal history of Methicillin resistant Staphylococcus aureus infection: Secondary | ICD-10-CM | POA: Diagnosis not present

## 2023-03-26 DIAGNOSIS — K769 Liver disease, unspecified: Secondary | ICD-10-CM

## 2023-03-26 DIAGNOSIS — Z86010 Personal history of colonic polyps: Secondary | ICD-10-CM

## 2023-03-26 DIAGNOSIS — Z1211 Encounter for screening for malignant neoplasm of colon: Secondary | ICD-10-CM | POA: Insufficient documentation

## 2023-03-26 DIAGNOSIS — K648 Other hemorrhoids: Secondary | ICD-10-CM | POA: Diagnosis not present

## 2023-03-26 DIAGNOSIS — Z13810 Encounter for screening for upper gastrointestinal disorder: Secondary | ICD-10-CM | POA: Diagnosis not present

## 2023-03-26 HISTORY — PX: BIOPSY: SHX5522

## 2023-03-26 HISTORY — PX: ESOPHAGOGASTRODUODENOSCOPY (EGD) WITH PROPOFOL: SHX5813

## 2023-03-26 HISTORY — PX: COLONOSCOPY WITH PROPOFOL: SHX5780

## 2023-03-26 SURGERY — COLONOSCOPY WITH PROPOFOL
Anesthesia: General

## 2023-03-26 MED ORDER — PROPOFOL 500 MG/50ML IV EMUL
INTRAVENOUS | Status: DC | PRN
  Administered 2023-03-26: 150 ug/kg/min via INTRAVENOUS

## 2023-03-26 MED ORDER — PHENYLEPHRINE HCL (PRESSORS) 10 MG/ML IV SOLN
INTRAVENOUS | Status: DC | PRN
  Administered 2023-03-26: 100 ug via INTRAVENOUS

## 2023-03-26 MED ORDER — PROPOFOL 10 MG/ML IV BOLUS
INTRAVENOUS | Status: DC | PRN
  Administered 2023-03-26: 80 mg via INTRAVENOUS
  Administered 2023-03-26: 50 mg via INTRAVENOUS

## 2023-03-26 MED ORDER — PHENYLEPHRINE 80 MCG/ML (10ML) SYRINGE FOR IV PUSH (FOR BLOOD PRESSURE SUPPORT)
PREFILLED_SYRINGE | INTRAVENOUS | Status: AC
  Filled 2023-03-26: qty 10

## 2023-03-26 MED ORDER — LIDOCAINE HCL (PF) 2 % IJ SOLN
INTRAMUSCULAR | Status: AC
  Filled 2023-03-26: qty 15

## 2023-03-26 MED ORDER — LACTATED RINGERS IV SOLN
INTRAVENOUS | Status: DC
  Administered 2023-03-26: 1000 mL via INTRAVENOUS

## 2023-03-26 MED ORDER — LIDOCAINE HCL (PF) 2 % IJ SOLN
INTRAMUSCULAR | Status: AC
  Filled 2023-03-26: qty 5

## 2023-03-26 MED ORDER — LIDOCAINE HCL (CARDIAC) PF 100 MG/5ML IV SOSY
PREFILLED_SYRINGE | INTRAVENOUS | Status: DC | PRN
  Administered 2023-03-26: 60 mg via INTRAVENOUS
  Administered 2023-03-26: 40 mg via INTRAVENOUS

## 2023-03-26 NOTE — Anesthesia Postprocedure Evaluation (Signed)
Anesthesia Post Note  Patient: Karem Chadick  Procedure(s) Performed: COLONOSCOPY WITH PROPOFOL ESOPHAGOGASTRODUODENOSCOPY (EGD) WITH PROPOFOL BIOPSY  Patient location during evaluation: Phase II Anesthesia Type: General Level of consciousness: awake and alert and oriented Pain management: pain level controlled Vital Signs Assessment: post-procedure vital signs reviewed and stable Respiratory status: spontaneous breathing, nonlabored ventilation and respiratory function stable Cardiovascular status: blood pressure returned to baseline and stable Postop Assessment: no apparent nausea or vomiting Anesthetic complications: no  No notable events documented.   Last Vitals:  Vitals:   03/26/23 1021 03/26/23 1128  BP: 138/80 (!) 108/52  Pulse: (!) 58 63  Resp: 17 16  Temp: 36.6 C 36.7 C  SpO2: 99% 98%    Last Pain:  Vitals:   03/26/23 1128  TempSrc: Oral  PainSc: 0-No pain                 Latavious Bitter C Luv Mish

## 2023-03-26 NOTE — Discharge Instructions (Addendum)
EGD Discharge instructions Please read the instructions outlined below and refer to this sheet in the next few weeks. These discharge instructions provide you with general information on caring for yourself after you leave the hospital. Your doctor may also give you specific instructions. While your treatment has been planned according to the most current medical practices available, unavoidable complications occasionally occur. If you have any problems or questions after discharge, please call your doctor. ACTIVITY You may resume your regular activity but move at a slower pace for the next 24 hours.  Take frequent rest periods for the next 24 hours.  Walking will help expel (get rid of) the air and reduce the bloated feeling in your abdomen.  No driving for 24 hours (because of the anesthesia (medicine) used during the test).  You may shower.  Do not sign any important legal documents or operate any machinery for 24 hours (because of the anesthesia used during the test).  NUTRITION Drink plenty of fluids.  You may resume your normal diet.  Begin with a light meal and progress to your normal diet.  Avoid alcoholic beverages for 24 hours or as instructed by your caregiver.  MEDICATIONS You may resume your normal medications unless your caregiver tells you otherwise.  WHAT YOU CAN EXPECT TODAY You may experience abdominal discomfort such as a feeling of fullness or "gas" pains.  FOLLOW-UP Your doctor will discuss the results of your test with you.  SEEK IMMEDIATE MEDICAL ATTENTION IF ANY OF THE FOLLOWING OCCUR: Excessive nausea (feeling sick to your stomach) and/or vomiting.  Severe abdominal pain and distention (swelling).  Trouble swallowing.  Temperature over 101 F (37.8 C).  Rectal bleeding or vomiting of blood.     Colonoscopy Discharge Instructions  Read the instructions outlined below and refer to this sheet in the next few weeks. These discharge instructions provide you with  general information on caring for yourself after you leave the hospital. Your doctor may also give you specific instructions. While your treatment has been planned according to the most current medical practices available, unavoidable complications occasionally occur.   ACTIVITY You may resume your regular activity, but move at a slower pace for the next 24 hours.  Take frequent rest periods for the next 24 hours.  Walking will help get rid of the air and reduce the bloated feeling in your belly (abdomen).  No driving for 24 hours (because of the medicine (anesthesia) used during the test).   Do not sign any important legal documents or operate any machinery for 24 hours (because of the anesthesia used during the test).  NUTRITION Drink plenty of fluids.  You may resume your normal diet as instructed by your doctor.  Begin with a light meal and progress to your normal diet. Heavy or fried foods are harder to digest and may make you feel sick to your stomach (nauseated).  Avoid alcoholic beverages for 24 hours or as instructed.  MEDICATIONS You may resume your normal medications unless your doctor tells you otherwise.  WHAT YOU CAN EXPECT TODAY Some feelings of bloating in the abdomen.  Passage of more gas than usual.  Spotting of blood in your stool or on the toilet paper.  IF YOU HAD POLYPS REMOVED DURING THE COLONOSCOPY: No aspirin products for 7 days or as instructed.  No alcohol for 7 days or as instructed.  Eat a soft diet for the next 24 hours.  FINDING OUT THE RESULTS OF YOUR TEST Not all test results are  available during your visit. If your test results are not back during the visit, make an appointment with your caregiver to find out the results. Do not assume everything is normal if you have not heard from your caregiver or the medical facility. It is important for you to follow up on all of your test results.  SEEK IMMEDIATE MEDICAL ATTENTION IF: You have more than a spotting of  blood in your stool.  Your belly is swollen (abdominal distention).  You are nauseated or vomiting.  You have a temperature over 101.  You have abdominal pain or discomfort that is severe or gets worse throughout the day.   Your EGD revealed mild amount inflammation in your stomach.  I took biopsies of this to rule out infection with a bacteria called H. pylori.  Await pathology results, my office will contact you.  Esophagus and small bowel appeared normal. Continue on Nexium daily.   Your colonoscopy was relatively unremarkable.  I did not find any polyps or evidence of colon cancer.  I recommend repeating colonoscopy in 5 years given your history of polyps prior.    You do have diverticulosis and internal hemorrhoids. I would recommend increasing fiber in your diet or adding OTC Benefiber/Metamucil. Be sure to drink at least 4 to 6 glasses of water daily. Follow-up with GI in 3 months   I hope you have a great rest of your week!  Hennie Duos. Marletta Lor, D.O. Gastroenterology and Hepatology [facility]

## 2023-03-26 NOTE — Telephone Encounter (Signed)
Spoke to pt, informed him of recommendations. Pt voiced understanding. Sent pt a MyChart also

## 2023-03-26 NOTE — Anesthesia Preprocedure Evaluation (Addendum)
Anesthesia Evaluation  Patient identified by MRN, date of birth, ID band Patient awake    Reviewed: Allergy & Precautions, H&P , NPO status , Patient's Chart, lab work & pertinent test results  Airway Mallampati: II  TM Distance: >3 FB Neck ROM: Full    Dental no notable dental hx. (+) Dental Advisory Given, Teeth Intact   Pulmonary neg pulmonary ROS   Pulmonary exam normal breath sounds clear to auscultation       Cardiovascular negative cardio ROS Normal cardiovascular exam Rhythm:Regular Rate:Normal     Neuro/Psych negative neurological ROS  negative psych ROS   GI/Hepatic Neg liver ROS,GERD  Medicated and Controlled,,  Endo/Other  negative endocrine ROS    Renal/GU negative Renal ROS  negative genitourinary   Musculoskeletal negative musculoskeletal ROS (+)    Abdominal   Peds negative pediatric ROS (+)  Hematology negative hematology ROS (+)   Anesthesia Other Findings   Reproductive/Obstetrics negative OB ROS                             Anesthesia Physical Anesthesia Plan  ASA: 2  Anesthesia Plan: General   Post-op Pain Management: Minimal or no pain anticipated   Induction: Intravenous  PONV Risk Score and Plan: 1 and Propofol infusion  Airway Management Planned: Nasal Cannula and Natural Airway  Additional Equipment:   Intra-op Plan:   Post-operative Plan:   Informed Consent: I have reviewed the patients History and Physical, chart, labs and discussed the procedure including the risks, benefits and alternatives for the proposed anesthesia with the patient or authorized representative who has indicated his/her understanding and acceptance.     Dental advisory given  Plan Discussed with: CRNA and Surgeon  Anesthesia Plan Comments:        Anesthesia Quick Evaluation

## 2023-03-26 NOTE — Interval H&P Note (Signed)
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  The various methods of treatment have been discussed with the patient and family. After consideration of risks, benefits and other options for treatment, the patient has consented to  Procedure(s) with comments: COLONOSCOPY WITH PROPOFOL (N/A) - 145pm, asa 2 ESOPHAGOGASTRODUODENOSCOPY (EGD) WITH PROPOFOL (N/A) as a surgical intervention.  The patient's history has been reviewed, patient examined, no change in status, stable for surgery.  I have reviewed the patient's chart and labs.  Questions were answered to the patient's satisfaction.     Lanelle Bal

## 2023-03-26 NOTE — Transfer of Care (Signed)
Immediate Anesthesia Transfer of Care Note  Patient: Ryan Christian  Procedure(s) Performed: COLONOSCOPY WITH PROPOFOL ESOPHAGOGASTRODUODENOSCOPY (EGD) WITH PROPOFOL BIOPSY  Patient Location: PACU  Anesthesia Type:General  Level of Consciousness: drowsy  Airway & Oxygen Therapy: Patient Spontanous Breathing  Post-op Assessment: Report given to RN and Post -op Vital signs reviewed and stable  Post vital signs: Reviewed and stable  Last Vitals:  Vitals Value Taken Time  BP 108/52 03/26/23 1128  Temp 36.7 C 03/26/23 1128  Pulse 63 03/26/23 1128  Resp 16 03/26/23 1128  SpO2 98 % 03/26/23 1128    Last Pain:  Vitals:   03/26/23 1128  TempSrc: Oral  PainSc: 0-No pain      Patients Stated Pain Goal: 7 (03/26/23 1021)  Complications: No notable events documented.

## 2023-03-26 NOTE — Op Note (Signed)
[facility] Patient Name: Ryan Christian Procedure Date: 03/26/2023 11:03 AM MRN: [id number] Date of Birth: 08-11-1971 Attending MD: Hennie Duos. Marletta Lor , [place], [id number] CSN: [id number] Age: 52 Admit Type: Outpatient Procedure:                Colonoscopy Indications:              Surveillance: Personal history of adenomatous                            polyps on last colonoscopy > 5 years ago Providers:                Hennie Duos. Marletta Lor, DO, Jannett Celestine, RN, Burke Keels, Technician Referring MD:              Medicines:                See the Anesthesia note for documentation of the                            administered medications Complications:            No immediate complications. Estimated Blood Loss:     Estimated blood loss: none. Procedure:                Pre-Anesthesia Assessment:                           - The anesthesia plan was to use monitored                            anesthesia care (MAC).                           After obtaining informed consent, the colonoscope                            was passed under direct vision. Throughout the                            procedure, the patient's blood pressure, pulse, and                            oxygen saturations were monitored continuously. The                            [id number]-HQ190L ([id number]) scope was introduced through                            the anus and advanced to the the cecum, identified                            by appendiceal orifice and ileocecal valve. The                            colonoscopy was performed without  difficulty. The                            patient tolerated the procedure well. The quality                            of the bowel preparation was evaluated using the                            BBPS [facility] Bowel Preparation Scale) with scores                            of: Right Colon = 2 (minor amount of residual                            staining, small  fragments of stool and/or opaque                            liquid, but mucosa seen well), Transverse Colon = 2                            (minor amount of residual staining, small fragments                            of stool and/or opaque liquid, but mucosa seen                            well) and Left Colon = 2 (minor amount of residual                            staining, small fragments of stool and/or opaque                            liquid, but mucosa seen well). The total BBPS score                            equals 6. Fair. Scope In: 11:06:03 AM Scope Out: 11:23:06 AM Scope Withdrawal Time: 0 hours 12 minutes 49 seconds  Total Procedure Duration: 0 hours 17 minutes 3 seconds  Findings:      Non-bleeding internal hemorrhoids were found during endoscopy.      A moderate amount of semi-liquid stool was found in the entire colon,       making visualization difficult. Lavage of the area was performed using       copious amounts of sterile water, resulting in clearance with fair       visualization.      Many large-mouthed and small-mouthed diverticula were found in the       sigmoid colon and descending colon.      The exam was otherwise without abnormality. Impression:               - Non-bleeding internal hemorrhoids.                           - Stool in the  entire examined colon.                           - Diverticulosis in the sigmoid colon and in the                            descending colon.                           - The examination was otherwise normal.                           - No specimens collected. Moderate Sedation:      Per Anesthesia Care Recommendation:           - Patient has a contact number available for                            emergencies. The signs and symptoms of potential                            delayed complications were discussed with the                            patient. Return to normal activities tomorrow.                             Written discharge instructions were provided to the                            patient.                           - Resume previous diet.                           - Continue present medications.                           - Repeat colonoscopy in 5 years for history of                            polyps prior and borderline prep today.                           - Return to GI clinic in 3 months. Procedure Code(s):        --- Professional ---                           Z6109, Colorectal cancer screening; colonoscopy on                            individual at high risk Diagnosis Code(s):        --- Professional ---                           Z86.010, Personal history of  colonic polyps                           K64.8, Other hemorrhoids                           K57.30, Diverticulosis of large intestine without                            perforation or abscess without bleeding CPT copyright 2022 American Medical Association. All rights reserved. The codes documented in this report are preliminary and upon coder review may  be revised to meet current compliance requirements. Hennie Duos. Marletta Lor, DO Hennie Duos. Marletta Lor, DO 03/26/2023 11:27:28 AM This report has been signed electronically. Number of Addenda: 0

## 2023-03-26 NOTE — Telephone Encounter (Signed)
Patient brought imaging disc to Dr. Marletta Lor today when he presented for his procedure.  Dr. Marletta Lor reviewed with radiology.  There was a written report on the disc that noted multiple small low-density subcentimeter lesions scattered throughout the liver which are too small to characterize.  Dr. Marletta Lor reviewed with radiology at [facility].  These lesions do not appear concerning.  No further evaluation needed.  Toni Amend, please let patient know that Dr. Marletta Lor reviewed his imaging with radiology.  He has some very small spots on his liver that do not look concerning.  No further evaluation is needed.  Does he want his disc back? If so, he can come by the office to pick it up. Otherwise, we will discard.

## 2023-03-26 NOTE — Op Note (Signed)
[facility] Patient Name: Ryan Christian Procedure Date: 03/26/2023 10:41 AM MRN: [id number] Date of Birth: May 09, 1971 Attending MD: Hennie Duos. Marletta Lor , [place], [id number] CSN: [id number] Age: 52 Admit Type: Outpatient Procedure:                Upper GI endoscopy Indications:              Screening for Barrett's esophagus, Screening for                            Barrett's esophagus in patient at risk for this                            condition Providers:                Hennie Duos. Marletta Lor, DO, Jannett Celestine, RN, Burke Keels, Technician Referring MD:              Medicines:                See the Anesthesia note for documentation of the                            administered medications Complications:            No immediate complications. Estimated Blood Loss:     Estimated blood loss was minimal. Procedure:                Pre-Anesthesia Assessment:                           - The anesthesia plan was to use monitored                            anesthesia care (MAC).                           After obtaining informed consent, the endoscope was                            passed under direct vision. Throughout the                            procedure, the patient's blood pressure, pulse, and                            oxygen saturations were monitored continuously. The                            GIF-H190 ([id number]) scope was introduced through the                            mouth, and advanced to the second part of duodenum.                            The upper GI endoscopy was  accomplished without                            difficulty. The patient tolerated the procedure                            well. Scope In: 10:57:05 AM Scope Out: 11:00:45 AM Total Procedure Duration: 0 hours 3 minutes 40 seconds  Findings:      The Z-line was regular.      Patchy minimal inflammation characterized by erythema was found in the       gastric body. Biopsies were taken  with a cold forceps for Helicobacter       pylori testing.      The duodenal bulb, first portion of the duodenum and second portion of       the duodenum were normal. Impression:               - Z-line regular.                           - Gastritis. Biopsied.                           - Normal duodenal bulb, first portion of the                            duodenum and second portion of the duodenum. Moderate Sedation:      Per Anesthesia Care Recommendation:           - Patient has a contact number available for                            emergencies. The signs and symptoms of potential                            delayed complications were discussed with the                            patient. Return to normal activities tomorrow.                            Written discharge instructions were provided to the                            patient.                           - Resume previous diet.                           - Continue present medications.                           - Await pathology results.                           - Use Nexium (esomeprazole) 20 mg PO daily.                           -  Return to GI clinic in 3 months. Procedure Code(s):        --- Professional ---                           [phone], Esophagogastroduodenoscopy, flexible,                            transoral; with biopsy, single or multiple Diagnosis Code(s):        --- Professional ---                           K29.70, Gastritis, unspecified, without bleeding                           Z13.810, Encounter for screening for upper                            gastrointestinal disorder CPT copyright 2022 [employer]. All rights reserved. The codes documented in this report are preliminary and upon coder review may  be revised to meet current compliance requirements. Hennie Duos. Marletta Lor, DO Hennie Duos. Marletta Lor, DO 03/26/2023 11:03:43 AM This report has been signed electronically. Number of Addenda: 0

## 2023-03-27 LAB — SURGICAL PATHOLOGY

## 2023-04-01 ENCOUNTER — Encounter (HOSPITAL_COMMUNITY): Payer: Self-pay | Admitting: Internal Medicine

## 2023-04-24 ENCOUNTER — Encounter: Payer: Self-pay | Admitting: Gastroenterology
